# Patient Record
Sex: Female | Born: 1965 | Race: White | Hispanic: No | Smoking: Never smoker
Health system: Southern US, Community
[De-identification: ages and names within clinical notes are randomized; demographics above are authoritative.]

## PROBLEM LIST (undated history)

## (undated) DIAGNOSIS — J45909 Unspecified asthma, uncomplicated: Secondary | ICD-10-CM

## (undated) DIAGNOSIS — I10 Essential (primary) hypertension: Secondary | ICD-10-CM

## (undated) DIAGNOSIS — F329 Major depressive disorder, single episode, unspecified: Secondary | ICD-10-CM

## (undated) DIAGNOSIS — F32A Depression, unspecified: Secondary | ICD-10-CM

## (undated) DIAGNOSIS — D649 Anemia, unspecified: Secondary | ICD-10-CM

## (undated) DIAGNOSIS — F419 Anxiety disorder, unspecified: Secondary | ICD-10-CM

## (undated) DIAGNOSIS — E119 Type 2 diabetes mellitus without complications: Secondary | ICD-10-CM

## (undated) HISTORY — DX: Depression, unspecified: F32.A

## (undated) HISTORY — PX: CHOLECYSTECTOMY: SHX55

## (undated) HISTORY — DX: Anemia, unspecified: D64.9

## (undated) HISTORY — DX: Type 2 diabetes mellitus without complications: E11.9

## (undated) HISTORY — DX: Anxiety disorder, unspecified: F41.9

## (undated) HISTORY — DX: Major depressive disorder, single episode, unspecified: F32.9

## (undated) HISTORY — DX: Unspecified asthma, uncomplicated: J45.909

---

## 2014-09-25 ENCOUNTER — Ambulatory Visit (INDEPENDENT_AMBULATORY_CARE_PROVIDER_SITE_OTHER): Payer: BLUE CROSS/BLUE SHIELD | Admitting: Emergency Medicine

## 2014-09-25 VITALS — BP 120/78 | HR 82 | Temp 98.2°F | Resp 14 | Ht 65.5 in | Wt 271.6 lb

## 2014-09-25 DIAGNOSIS — H60333 Swimmer's ear, bilateral: Secondary | ICD-10-CM

## 2014-09-25 DIAGNOSIS — H66009 Acute suppurative otitis media without spontaneous rupture of ear drum, unspecified ear: Secondary | ICD-10-CM

## 2014-09-25 MED ORDER — NEOMYCIN-POLYMYXIN-HC 3.5-10000-1 OT SUSP: 3.0000 [drp] | Freq: Four times a day (QID) | OTIC | Status: DC

## 2014-09-25 MED ORDER — TRIAMCINOLONE ACETONIDE 55 MCG/ACT NA AERO: 2.0000 | INHALATION_SPRAY | Freq: Every day | NASAL | Status: DC

## 2014-09-25 MED ORDER — AMOXICILLIN-POT CLAVULANATE 875-125 MG PO TABS: 1.0000 | ORAL_TABLET | Freq: Two times a day (BID) | ORAL | Status: DC

## 2014-09-25 NOTE — Progress Notes (Signed)
Subjective:  Patient ID: Shelby Scott, female    DOB: 1965-06-23  Age: 49 y.o. MRN: [id number]  CC: Difficulty hearing; Itchy ear; Tinnitus; and Ear Pain   HPI Ethan Kasperski presents  gives a history of recurrent otitis media last treated by her ear and nose and throat doctor in [place]. She has noted decreased hearing over the last week associated with itching in her ears. She has tenderness in some pain. She said tinnitus been present for quite a while a fever chills nausea vomiting no cough or runny nose. No sore throat. She's had no improvement with over-the-counter medication.  History Rateel has a past medical history of Anemia; Anxiety; Asthma; Depression; and Diabetes mellitus without complication.   She has past surgical history that includes Cholecystectomy and Appendectomy.   Her  family history includes Diabetes in her brother, maternal grandmother, and paternal grandmother; Heart disease in her paternal grandfather; Hypertension in her father and paternal grandmother; Mental illness in her brother and maternal grandfather.  She   reports that she has never smoked. She does not have any smokeless tobacco history on file. Her alcohol and drug histories are not on file.  No outpatient prescriptions prior to visit.   No facility-administered medications prior to visit.    History   Social History  . Marital Status: Divorced    Spouse Name: N/A  . Number of Children: N/A  . Years of Education: N/A   Social History Main Topics  . Smoking status: Never Smoker   . Smokeless tobacco: Not on file  . Alcohol Use: Not on file  . Drug Use: Not on file  . Sexual Activity: Not on file   Other Topics Concern  . None   Social History Narrative  . None     Review of Systems  Constitutional: Negative for fever, chills and appetite change.  HENT: Positive for ear pain, hearing loss and tinnitus. Negative for congestion, postnasal drip, sinus pressure and sore throat.     Eyes: Negative for pain and redness.  Respiratory: Negative for cough, shortness of breath and wheezing.   Cardiovascular: Negative for leg swelling.  Gastrointestinal: Negative for nausea, vomiting, abdominal pain, diarrhea, constipation and blood in stool.  Endocrine: Negative for polyuria.  Genitourinary: Negative for dysuria, urgency, frequency and flank pain.  Musculoskeletal: Negative for gait problem.  Skin: Negative for rash.  Neurological: Negative for weakness and headaches.  Psychiatric/Behavioral: Negative for confusion and decreased concentration. The patient is not nervous/anxious.     Objective:  BP 120/78 mmHg  Pulse 82  Temp(Src) 98.2 F (36.8 C) (Oral)  Resp 14  Ht 5' 5.5" (1.664 m)  Wt 271 lb 9.6 oz (123.197 kg)  BMI 44.49 kg/m2  SpO2 98%  LMP 09/04/2014  Physical Exam  Constitutional: She is oriented to person, place, and time. She appears well-developed and well-nourished. No distress.  HENT:  Head: Normocephalic and atraumatic.  Right Ear: There is drainage. Tympanic membrane is not perforated and not erythematous. A middle ear effusion is present.  Left Ear: There is drainage. Tympanic membrane is not perforated and not erythematous. A middle ear effusion is present.  Nose: Nose normal.  Eyes: Conjunctivae and EOM are normal. Pupils are equal, round, and reactive to light. No scleral icterus.  Neck: Normal range of motion. Neck supple. No tracheal deviation present.  Cardiovascular: Normal rate, regular rhythm and normal heart sounds.   Pulmonary/Chest: Effort normal. No respiratory distress. She has no wheezes. She has no rales.  Abdominal: She exhibits no mass. There is no tenderness. There is no rebound and no guarding.  Musculoskeletal: She exhibits no edema.  Lymphadenopathy:    She has no cervical adenopathy.  Neurological: She is alert and oriented to person, place, and time. Coordination normal.  Skin: Skin is warm and dry. No rash noted.   Psychiatric: She has a normal mood and affect. Her behavior is normal.   She has bilateral serous otitis media as well as changes consistent with external otitis in both ears.   Assessment & Plan:   Xyla was seen today for difficulty hearing, itchy ear, tinnitus and ear pain.  Diagnoses and all orders for this visit:  Acute suppurative otitis media without spontaneous rupture of ear drum, recurrence not specified, unspecified laterality  Swimmer's ear, bilateral  Other orders -     triamcinolone (NASACORT AQ) 55 MCG/ACT AERO nasal inhaler; Place 2 sprays into the nose daily. -     amoxicillin-clavulanate (AUGMENTIN) 875-125 MG per tablet; Take 1 tablet by mouth 2 (two) times daily. -     neomycin-polymyxin-hydrocortisone (CORTISPORIN) 3.5-10000-1 otic suspension; Place 3 drops into both ears 4 (four) times daily.   I am having Ms. Evangelist start on triamcinolone, amoxicillin-clavulanate, and neomycin-polymyxin-hydrocortisone. I am also having her maintain her metFORMIN, venlafaxine XR, glimepiride, gabapentin, insulin detemir, and Liraglutide (VICTOZA [place]).  Meds ordered this encounter  Medications  . metFORMIN (GLUCOPHAGE) 1000 MG tablet    Sig: Take 1,000 mg by mouth 2 (two) times daily with a meal.  . venlafaxine XR (EFFEXOR XR) 150 MG 24 hr capsule    Sig: Take 150 mg by mouth daily with breakfast.  . glimepiride (AMARYL) 2 MG tablet    Sig: Take 2 mg by mouth daily with breakfast.  . gabapentin (NEURONTIN) 300 MG capsule    Sig: Take 300 mg by mouth 3 (three) times daily.  . insulin detemir (LEVEMIR) 100 UNIT/ML injection    Sig: Inject into the skin 2 (two) times daily.  . Liraglutide (VICTOZA [place])    Sig: Inject into the skin.  Marland Kitchen triamcinolone (NASACORT AQ) 55 MCG/ACT AERO nasal inhaler    Sig: Place 2 sprays into the nose daily.    Dispense:  1 Inhaler    Refill:  12  . amoxicillin-clavulanate (AUGMENTIN) 875-125 MG per tablet    Sig: Take 1 tablet by mouth 2 (two)  times daily.    Dispense:  20 tablet    Refill:  0  . neomycin-polymyxin-hydrocortisone (CORTISPORIN) 3.5-10000-1 otic suspension    Sig: Place 3 drops into both ears 4 (four) times daily.    Dispense:  10 mL    Refill:  0    Appropriate red flag conditions were discussed with the patient as well as actions that should be taken.  Patient expressed his understanding.  Follow-up: Return if symptoms worsen or fail to improve.  Carmelina Dane, MD

## 2014-09-25 NOTE — Patient Instructions (Signed)
Serous Otitis Media °Serous otitis media is fluid in the middle ear space. This space contains the bones for hearing and air. Air in the middle ear space helps to transmit sound.  °The air gets there through the eustachian tube. This tube goes from the back of the nose (nasopharynx) to the middle ear space. It keeps the pressure in the middle ear the same as the outside world. It also helps to drain fluid from the middle ear space. °CAUSES  °Serous otitis media occurs when the eustachian tube gets blocked. Blockage can come from: °· Ear infections. °· Colds and other upper respiratory infections. °· Allergies. °· Irritants such as cigarette smoke. °· Sudden changes in air pressure (such as descending in an airplane). °· Enlarged adenoids. °· A mass in the nasopharynx. °During colds and upper respiratory infections, the middle ear space can become temporarily filled with fluid. This can happen after an ear infection also. Once the infection clears, the fluid will generally drain out of the ear through the eustachian tube. If it does not, then serous otitis media occurs. °SIGNS AND SYMPTOMS  °· Hearing loss. °· A feeling of fullness in the ear, without pain. °· Young children may not show any symptoms but may show slight behavioral changes, such as agitation, ear pulling, or crying. °DIAGNOSIS  °Serous otitis media is diagnosed by an ear exam. Tests may be done to check on the movement of the eardrum. Hearing exams may also be done. °TREATMENT  °The fluid most often goes away without treatment. If allergy is the cause, allergy treatment may be helpful. Fluid that persists for several months may require minor surgery. A small tube is placed in the eardrum to: °· Drain the fluid. °· Restore the air in the middle ear space. °In certain situations, antibiotic medicines are used to avoid surgery. Surgery may be done to remove enlarged adenoids (if this is the cause). °HOME CARE INSTRUCTIONS  °· Keep children away from  tobacco smoke. °· Keep all follow-up visits as directed by your health care provider. °SEEK MEDICAL CARE IF:  °· Your hearing is not better in 3 months. °· Your hearing is worse. °· You have ear pain. °· You have drainage from the ear. °· You have dizziness. °· You have serous otitis media only in one ear or have any bleeding from your nose (epistaxis). °· You notice a lump on your neck. °MAKE SURE YOU: °· Understand these instructions.   °· Will watch your condition.   °· Will get help right away if you are not doing well or get worse.   °Document Released: 05/02/2003 Document Revised: 06/26/2013 Document Reviewed: 09/06/2012 °[employer]® Patient Information ©2015 [employer]. This information is not intended to replace advice given to you by your health care provider. Make sure you discuss any questions you have with your health care provider. ° °

## 2014-10-08 ENCOUNTER — Ambulatory Visit (INDEPENDENT_AMBULATORY_CARE_PROVIDER_SITE_OTHER): Payer: BLUE CROSS/BLUE SHIELD | Admitting: Physician Assistant

## 2014-10-08 VITALS — BP 156/82 | HR 82 | Temp 98.3°F | Resp 16 | Ht 65.5 in | Wt 270.0 lb

## 2014-10-08 DIAGNOSIS — S90829A Blister (nonthermal), unspecified foot, initial encounter: Secondary | ICD-10-CM

## 2014-10-08 DIAGNOSIS — S90821A Blister (nonthermal), right foot, initial encounter: Secondary | ICD-10-CM

## 2014-10-08 DIAGNOSIS — E1140 Type 2 diabetes mellitus with diabetic neuropathy, unspecified: Secondary | ICD-10-CM | POA: Diagnosis not present

## 2014-10-08 DIAGNOSIS — S90822A Blister (nonthermal), left foot, initial encounter: Secondary | ICD-10-CM

## 2014-10-08 NOTE — Progress Notes (Signed)
   Subjective:    Patient ID: Shelby Scott, female    DOB: [birth year], 49 y.o.   MRN: [id number]  Chief Complaint  Patient presents with  . Blisters    Onset 4 days/ diabetic   Medications, allergies, past medical history, surgical history, family history, social history and problem list reviewed and updated.  HPI  20 yof presents with blisters on feet.  New to area and is type 2 diabetic. Was helping a friend move while wearing poorly fitting sandals few days ago and developed several blisters bilateral feet. Has not been applying anything to area. Denies fevers, chills. She had uri sx 2 wks ago and just finished a 10 day course of augmentin yest.   Unsure of last a1c but thinks was in the 6 range. She is planning to establish care here.   Review of Systems See HPI.     Objective:   Physical Exam  Constitutional: She is oriented to person, place, and time. She appears well-developed and well-nourished.  Non-toxic appearance. She does not have a sickly appearance. She does not appear ill. No distress.  BP 156/82 mmHg  Pulse 82  Temp(Src) 98.3 F (36.8 C) (Oral)  Resp 16  Ht 5' 5.5" (1.664 m)  Wt 270 lb (122.471 kg)  BMI 44.23 kg/m2  SpO2 98%  LMP 09/04/2014   Cardiovascular:  Normal cap refill.   Neurological: She is alert and oriented to person, place, and time.  Skin:  1cm x 1cm blister plantar great toe left foot. Smaller blister just proximal to this. 5mm blister right plantar foot. No drainage. No surrounding erythema.       Assessment & Plan:   Friction blisters of the soles, unspecified laterality, initial encounter  Type 2 diabetes mellitus with diabetic neuropathy --wounds cleansed and dressed per procedure note --leave open while at home, cover when out --no signs of infection today and just completed augmentin course --strongly encouraged to establish with pcp here asap to establish dm care and possibly see podiatry if wounds don't heal well   Donnajean Lopes, PA-C Physician Assistant-Certified [facility]  10/08/2014 1:34 PM

## 2014-10-08 NOTE — Progress Notes (Signed)
Blisters on bilateral soles cleansed with warm soap and water. Dressed with non stick gauze, gauze, and coban. Pt instructed to leave wounds open while clean and at home.

## 2015-01-05 ENCOUNTER — Ambulatory Visit (INDEPENDENT_AMBULATORY_CARE_PROVIDER_SITE_OTHER): Payer: 59 | Admitting: Emergency Medicine

## 2015-01-05 VITALS — BP 118/82 | HR 92 | Temp 98.2°F | Resp 16 | Ht 66.25 in | Wt 269.0 lb

## 2015-01-05 DIAGNOSIS — E1140 Type 2 diabetes mellitus with diabetic neuropathy, unspecified: Secondary | ICD-10-CM

## 2015-01-05 DIAGNOSIS — Z23 Encounter for immunization: Secondary | ICD-10-CM

## 2015-01-05 LAB — GLUCOSE, POCT (MANUAL RESULT ENTRY): POC Glucose: 236 mg/dl — AB (ref 70–99)

## 2015-01-05 LAB — POCT URINALYSIS DIP (MANUAL ENTRY)
BILIRUBIN UA: NEGATIVE
Glucose, UA: 100 — AB
NITRITE UA: NEGATIVE
PH UA: 6
RBC UA: NEGATIVE
Spec Grav, UA: 1.025
UROBILINOGEN UA: 0.2

## 2015-01-05 LAB — LIPID PANEL
Cholesterol: 155 mg/dL (ref 125–200)
HDL: 42 mg/dL — AB (ref >=46)
LDL CALC: 72 mg/dL (ref <130)
Total CHOL/HDL Ratio: 3.7 Ratio (ref <=5.0)
Triglycerides: 206 mg/dL — ABNORMAL HIGH (ref <150)
VLDL: 41 mg/dL — ABNORMAL HIGH (ref <30)

## 2015-01-05 LAB — COMPREHENSIVE METABOLIC PANEL
ALK PHOS: 101 U/L (ref 33–130)
ALT: 23 U/L (ref 6–29)
AST: 21 U/L (ref 10–35)
Albumin: 4.2 g/dL (ref 3.6–5.1)
BILIRUBIN TOTAL: 0.3 mg/dL (ref 0.2–1.2)
BUN: 10 mg/dL (ref 7–25)
CALCIUM: 8.7 mg/dL (ref 8.6–10.4)
CO2: 24 mmol/L (ref 20–31)
CREATININE: 0.63 mg/dL (ref 0.50–1.05)
Chloride: 99 mmol/L (ref 98–110)
GLUCOSE: 243 mg/dL — AB (ref 65–99)
Potassium: 4.2 mmol/L (ref 3.5–5.3)
SODIUM: 134 mmol/L — AB (ref 135–146)
Total Protein: 7.4 g/dL (ref 6.1–8.1)

## 2015-01-05 LAB — POC MICROSCOPIC URINALYSIS (UMFC)

## 2015-01-05 LAB — POCT GLYCOSYLATED HEMOGLOBIN (HGB A1C): HEMOGLOBIN A1C: 8.5

## 2015-01-05 LAB — HEMOGLOBIN A1C: Hgb A1c MFr Bld: 8.5 % — AB (ref 4.0–6.0)

## 2015-01-05 MED ORDER — METFORMIN HCL 1000 MG PO TABS: 1000.0000 mg | ORAL_TABLET | Freq: Two times a day (BID) | ORAL | Status: DC

## 2015-01-05 MED ORDER — GLIMEPIRIDE 4 MG PO TABS: 4.0000 mg | ORAL_TABLET | Freq: Two times a day (BID) | ORAL | Status: DC

## 2015-01-05 MED ORDER — INSULIN DETEMIR 100 UNIT/ML ~~LOC~~ SOLN: SUBCUTANEOUS | Status: DC

## 2015-01-05 MED ORDER — VENLAFAXINE HCL ER 150 MG PO CP24: 150.0000 mg | ORAL_CAPSULE | Freq: Every day | ORAL | Status: DC

## 2015-01-05 MED ORDER — GABAPENTIN 300 MG PO CAPS: 300.0000 mg | ORAL_CAPSULE | Freq: Three times a day (TID) | ORAL | Status: DC

## 2015-01-05 MED ORDER — LIRAGLUTIDE 18 MG/3ML ~~LOC~~ SOPN: PEN_INJECTOR | SUBCUTANEOUS | Status: DC

## 2015-01-05 NOTE — Patient Instructions (Signed)

## 2015-01-05 NOTE — Progress Notes (Signed)
Subjective:  Patient ID: Shelby Scott, female    DOB: Dec 20, 1965  Age: 49 y.o. MRN: [id number]  CC: Medication Refill and Flu Vaccine   HPI Shelby Scott presents  for refill on her medication. She's a type II diabetic takes insulin and has neuropathy complicating her diabetes. She's recently moved here from [place] and has no doctor the area. She's not been seen by ophthalmologist nor and endocrinologist. Her sugars are running high. She has no other complaints.  History Echo has a past medical history of Anemia; Anxiety; Asthma; Depression; and Diabetes mellitus without complication (HCC).   She has past surgical history that includes Cholecystectomy and Appendectomy.   Her  family history includes Diabetes in her brother, maternal grandmother, and paternal grandmother; Heart disease in her paternal grandfather; Hypertension in her father and paternal grandmother; Mental illness in her brother and maternal grandfather.  She   reports that she has never smoked. She does not have any smokeless tobacco history on file. Her alcohol and drug histories are not on file.  Outpatient Prescriptions Prior to Visit  Medication Sig Dispense Refill  . triamcinolone (NASACORT AQ) 55 MCG/ACT AERO nasal inhaler Place 2 sprays into the nose daily. 1 Inhaler 12  . gabapentin (NEURONTIN) 300 MG capsule Take 300 mg by mouth 3 (three) times daily.    Marland Kitchen glimepiride (AMARYL) 2 MG tablet Take 2 mg by mouth daily with breakfast.    . insulin detemir (LEVEMIR) 100 UNIT/ML injection Inject into the skin 2 (two) times daily.    . Liraglutide (VICTOZA ) Inject into the skin.    . metFORMIN (GLUCOPHAGE) 1000 MG tablet Take 1,000 mg by mouth 2 (two) times daily with a meal.    . venlafaxine XR (EFFEXOR XR) 150 MG 24 hr capsule Take 150 mg by mouth daily with breakfast.    . neomycin-polymyxin-hydrocortisone (CORTISPORIN) 3.5-10000-1 otic suspension Place 3 drops into both ears 4 (four) times daily. (Patient not  taking: Reported on 01/05/2015) 10 mL 0   No facility-administered medications prior to visit.    Social History   Social History  . Marital Status: Divorced    Spouse Name: N/A  . Number of Children: N/A  . Years of Education: N/A   Social History Main Topics  . Smoking status: Never Smoker   . Smokeless tobacco: None  . Alcohol Use: None  . Drug Use: None  . Sexual Activity: Not Asked   Other Topics Concern  . None   Social History Narrative     Review of Systems  Constitutional: Negative for fever, chills and appetite change.  HENT: Negative for congestion, ear pain, postnasal drip, sinus pressure and sore throat.   Eyes: Negative for pain and redness.  Respiratory: Negative for cough, shortness of breath and wheezing.   Cardiovascular: Negative for leg swelling.  Gastrointestinal: Negative for nausea, vomiting, abdominal pain, diarrhea, constipation and blood in stool.  Endocrine: Negative for polyuria.  Genitourinary: Negative for dysuria, urgency, frequency and flank pain.  Musculoskeletal: Negative for gait problem.  Skin: Negative for rash.  Neurological: Positive for numbness. Negative for weakness and headaches.  Psychiatric/Behavioral: Negative for confusion and decreased concentration. The patient is not nervous/anxious.     Objective:  BP 118/82 mmHg  Pulse 92  Temp(Src) 98.2 F (36.8 C) (Oral)  Resp 16  Ht 5' 6.25" (1.683 m)  Wt 269 lb (122.018 kg)  BMI 43.08 kg/m2  SpO2 98%  LMP 12/25/2014  Physical Exam  Constitutional: She is oriented  to person, place, and time. She appears well-nourished. No distress.  Morbid obesity  HENT:  Head: Normocephalic and atraumatic.  Right Ear: External ear normal.  Left Ear: External ear normal.  Nose: Nose normal.  Eyes: Conjunctivae and EOM are normal. Pupils are equal, round, and reactive to light. No scleral icterus.  Neck: Normal range of motion. Neck supple. No tracheal deviation present.    Cardiovascular: Normal rate, regular rhythm and normal heart sounds.   Pulmonary/Chest: Effort normal. No respiratory distress. She has no wheezes. She has no rales.  Abdominal: She exhibits no mass. There is no tenderness. There is no rebound and no guarding.  Musculoskeletal: She exhibits no edema.  Lymphadenopathy:    She has no cervical adenopathy.  Neurological: She is alert and oriented to person, place, and time. Coordination normal.  Skin: Skin is warm and dry. No rash noted.  Psychiatric: She has a normal mood and affect. Her behavior is normal.      Assessment & Plan:   Shelby Scott was seen today for medication refill and flu vaccine.  Diagnoses and all orders for this visit:  Type 2 diabetes mellitus with diabetic neuropathy, unspecified long term insulin use status (HCC) -     POCT glucose (manual entry) -     POCT glycosylated hemoglobin (Hb A1C) -     Comprehensive metabolic panel -     Lipid panel -     VITAMIN D 25 Hydroxy (Vit-D Deficiency, Fractures) -     POCT urinalysis dipstick -     POCT Microscopic Urinalysis ([facility]) -     Ambulatory referral to Endocrinology -     Ambulatory referral to Ophthalmology  Flu vaccine need -     Flu Vaccine QUAD 36+ mos IM  Morbid obesity due to excess calories (HCC) -     Ambulatory referral to General Surgery  Other orders -     venlafaxine XR (EFFEXOR XR) 150 MG 24 hr capsule; Take 1 capsule (150 mg total) by mouth daily with breakfast. -     metFORMIN (GLUCOPHAGE) 1000 MG tablet; Take 1 tablet (1,000 mg total) by mouth 2 (two) times daily with a meal. -     Liraglutide (VICTOZA) 18 MG/3ML SOPN; As discussed -     insulin detemir (LEVEMIR) 100 UNIT/ML injection; As directed -     glimepiride (AMARYL) 4 MG tablet; Take 1 tablet (4 mg total) by mouth 2 (two) times daily. -     gabapentin (NEURONTIN) 300 MG capsule; Take 1 capsule (300 mg total) by mouth 3 (three) times daily.   I have changed Shelby Scott's Liraglutide  (VICTOZA [place]) to Liraglutide (VICTOZA) 18 MG/3ML SOPN. I have also changed her venlafaxine XR, metFORMIN, insulin detemir, glimepiride, and gabapentin. I am also having her maintain her triamcinolone and neomycin-polymyxin-hydrocortisone.  Meds ordered this encounter  Medications  . venlafaxine XR (EFFEXOR XR) 150 MG 24 hr capsule    Sig: Take 1 capsule (150 mg total) by mouth daily with breakfast.    Dispense:  90 capsule    Refill:  3  . metFORMIN (GLUCOPHAGE) 1000 MG tablet    Sig: Take 1 tablet (1,000 mg total) by mouth 2 (two) times daily with a meal.    Dispense:  120 tablet    Refill:  2  . Liraglutide (VICTOZA) 18 MG/3ML SOPN    Sig: As discussed    Dispense:  6 mL    Refill:  2  . insulin detemir (LEVEMIR) 100  UNIT/ML injection    Sig: As directed    Dispense:  10 mL    Refill:  2  . glimepiride (AMARYL) 4 MG tablet    Sig: Take 1 tablet (4 mg total) by mouth 2 (two) times daily.    Dispense:  60 tablet    Refill:  2  . gabapentin (NEURONTIN) 300 MG capsule    Sig: Take 1 capsule (300 mg total) by mouth 3 (three) times daily.    Dispense:  90 capsule    Refill:  2    Appropriate red flag conditions were discussed with the patient as well as actions that should be taken.  Patient expressed his understanding.  Follow-up: Return in about 3 months (around 04/07/2015).  Carmelina Dane, MD   Results for orders placed or performed in visit on 01/05/15  POCT glucose (manual entry)  Result Value Ref Range   POC Glucose 236 (A) 70 - 99 mg/dl  POCT glycosylated hemoglobin (Hb A1C)  Result Value Ref Range   Hemoglobin A1C 8.5   POCT urinalysis dipstick  Result Value Ref Range   Color, UA yellow yellow   Clarity, UA clear clear   Glucose, UA =100 (A) negative   Bilirubin, UA negative negative   Ketones, POC UA trace (5) (A) negative   Spec Grav, UA 1.025    Blood, UA negative negative   pH, UA 6.0    Protein Ur, POC =30 (A) negative   Urobilinogen, UA 0.2     Nitrite, UA Negative Negative   Leukocytes, UA Trace (A) Negative  POCT Microscopic Urinalysis ([facility])  Result Value Ref Range   WBC,UR,HPF,POC Few (A) None WBC/hpf   RBC,UR,HPF,POC None None RBC/hpf   Bacteria Moderate (A) None, Too numerous to count   Mucus Present (A) Absent   Epithelial Cells, UR Per Microscopy Moderate (A) None, Too numerous to count cells/hpf

## 2015-01-07 LAB — VITAMIN D 25 HYDROXY (VIT D DEFICIENCY, FRACTURES): VIT D 25 HYDROXY: 33 ng/mL (ref 30–100)

## 2015-01-08 ENCOUNTER — Telehealth: Payer: Self-pay

## 2015-01-08 NOTE — Telephone Encounter (Signed)
Pt calling about labs. Please review. Thanks  

## 2015-01-21 ENCOUNTER — Encounter: Payer: Self-pay | Admitting: Family Medicine

## 2015-02-02 ENCOUNTER — Other Ambulatory Visit: Payer: Self-pay

## 2015-02-02 ENCOUNTER — Ambulatory Visit (INDEPENDENT_AMBULATORY_CARE_PROVIDER_SITE_OTHER): Payer: 59 | Admitting: Family Medicine

## 2015-02-02 VITALS — BP 128/78 | HR 97 | Temp 98.1°F | Resp 18 | Ht 66.25 in | Wt 265.4 lb

## 2015-02-02 DIAGNOSIS — E1140 Type 2 diabetes mellitus with diabetic neuropathy, unspecified: Secondary | ICD-10-CM

## 2015-02-02 DIAGNOSIS — IMO0002 Reserved for concepts with insufficient information to code with codable children: Secondary | ICD-10-CM

## 2015-02-02 DIAGNOSIS — R3989 Other symptoms and signs involving the genitourinary system: Secondary | ICD-10-CM | POA: Diagnosis not present

## 2015-02-02 DIAGNOSIS — E1165 Type 2 diabetes mellitus with hyperglycemia: Secondary | ICD-10-CM | POA: Diagnosis not present

## 2015-02-02 LAB — POCT URINALYSIS DIP (MANUAL ENTRY)
Bilirubin, UA: NEGATIVE
Nitrite, UA: NEGATIVE
PROTEIN UA: NEGATIVE
RBC UA: NEGATIVE
SPEC GRAV UA: 1.025
Urobilinogen, UA: 0.2
pH, UA: 5.5

## 2015-02-02 LAB — POC MICROSCOPIC URINALYSIS (UMFC): Mucus: ABSENT

## 2015-02-02 MED ORDER — INSULIN DETEMIR 100 UNIT/ML ~~LOC~~ SOLN: SUBCUTANEOUS | Status: DC

## 2015-02-02 MED ORDER — CIPROFLOXACIN HCL 250 MG PO TABS: 250.0000 mg | ORAL_TABLET | Freq: Two times a day (BID) | ORAL | Status: DC

## 2015-02-02 NOTE — Telephone Encounter (Signed)
Pt saw dr Milus Glazierlauenstein earlier today and called in levemir but it was the vial not the pen patient needs to the pen  [phone] Best number

## 2015-02-02 NOTE — Patient Instructions (Signed)
Status: Finalresult Visible to patient:  Not Released Nextappt: None Dx:  Type 2 diabetes mellitus with diabeti...          4wk ago    Hemoglobin A1C 8.5   Resulting Agency [id number]      Specimen Collected: 01/05/15 8:53 AM Last Resulted: 01/05/15 8:53 AM                      Other Results from 01/05/2015         POCT glucose (manual entry)  Status: Finalresult Visible to patient:  Not Released Nextappt: None Dx:  Type 2 diabetes mellitus with diabeti...             Ref Range 4wk ago    POC Glucose 70 - 99 mg/dl 409236 (A)   Resulting Agency [facility]       Specimen Collected: 01/05/15 8:53 AM Last Resulted: 01/05/15 8:53 AM

## 2015-02-02 NOTE — Progress Notes (Signed)
Patient ID: Shelby Scott, female   DOB: 1965/04/29, 50 y.o.   MRN: [id number]   This chart was scribed for Elvina Sidle, MD by [facility], medical scribe at [facility].The patient was seen in exam room 12 and the patient's care was started at 10:31 AM.  Patient ID: Shelby Scott MRN: [id number], DOB: 16-Jan-1966, 49 y.o. Date of Encounter: 02/02/2015  Primary Physician: No primary care provider on file.  Chief Complaint:  Chief Complaint  Patient presents with  . Medication Refill    needs a new RX for Levemir.   Marland Kitchen Urinary Symptoms    x2 weeks, has noticed a color change in urine. No back or abdomen pain.    HPI:  Shelby Scott is a 49 y.o. female who presents to [facility] with concerns of urinary symptoms for the past two weeks. No dysuria, only noticed a discoloration (bright pink). Some urinary incontinence. Desk work  Medication Refill: Also needs a Levemir refill. She has been without her medication for 1 week.   Past Medical History  Diagnosis Date  . Anemia   . Anxiety   . Asthma   . Depression   . Diabetes mellitus without complication (HCC)     Home Meds: Prior to Admission medications   Medication Sig Start Date End Date Taking? Authorizing Provider  gabapentin (NEURONTIN) 300 MG capsule Take 1 capsule (300 mg total) by mouth 3 (three) times daily. 01/05/15  Yes Carmelina Dane, MD  glimepiride (AMARYL) 4 MG tablet Take 1 tablet (4 mg total) by mouth 2 (two) times daily. 01/05/15  Yes Carmelina Dane, MD  insulin detemir (LEVEMIR) 100 UNIT/ML injection As directed 01/05/15  Yes Carmelina Dane, MD  Liraglutide (VICTOZA) 18 MG/3ML SOPN As discussed 01/05/15  Yes Carmelina Dane, MD  metFORMIN (GLUCOPHAGE) 1000 MG tablet Take 1 tablet (1,000 mg total) by mouth 2 (two) times daily with a meal. 01/05/15  Yes Carmelina Dane, MD  triamcinolone (NASACORT AQ) 55 MCG/ACT AERO nasal inhaler Place 2 sprays into the nose  daily. 09/25/14  Yes Carmelina Dane, MD  venlafaxine XR (EFFEXOR XR) 150 MG 24 hr capsule Take 1 capsule (150 mg total) by mouth daily with breakfast. 01/05/15  Yes Carmelina Dane, MD  neomycin-polymyxin-hydrocortisone (CORTISPORIN) 3.5-10000-1 otic suspension Place 3 drops into both ears 4 (four) times daily. Patient not taking: Reported on 01/05/2015 09/25/14   Carmelina Dane, MD   Allergies: No Known Allergies  Social History   Social History  . Marital Status: Divorced    Spouse Name: N/A  . Number of Children: N/A  . Years of Education: N/A   Occupational History  . Not on file.   Social History Main Topics  . Smoking status: Never Smoker   . Smokeless tobacco: Not on file  . Alcohol Use: Not on file  . Drug Use: Not on file  . Sexual Activity: Not on file   Other Topics Concern  . Not on file   Social History Narrative    Review of Systems: Constitutional: negative for chills, fever, night sweats, weight changes, or fatigue  HEENT: negative for vision changes, hearing loss, congestion, rhinorrhea, ST, epistaxis, or sinus pressure Cardiovascular: negative for chest pain or palpitations Respiratory: negative for hemoptysis, wheezing, shortness of breath, or cough Abdominal: negative for abdominal pain, nausea, vomiting, diarrhea, or constipation Dermatological: negative for rash Neurologic: negative for headache, dizziness, or syncope All other systems reviewed and  are otherwise negative with the exception to those above and in the HPI.  Physical Exam: Blood pressure 128/78, pulse 97, temperature 98.1 F (36.7 C), temperature source Oral, resp. rate 18, height 5' 6.25" (1.683 m), weight 265 lb 6.4 oz (120.385 kg), last menstrual period 12/25/2014, SpO2 98 %., Body mass index is 42.5 kg/(m^2). General: Well developed, well nourished, in no acute distress. Head: Normocephalic, atraumatic, eyes without discharge, sclera non-icteric, nares are without discharge.  Bilateral auditory canals clear, TM's are without perforation, pearly grey and translucent with reflective cone of light bilaterally. Oral cavity moist, posterior pharynx without exudate, erythema, peritonsillar abscess, or post nasal drip.  Neck: Supple. No thyromegaly. Full ROM. No lymphadenopathy. Lungs: Clear bilaterally to auscultation without wheezes, rales, or rhonchi. Breathing is unlabored. Heart: RRR with S1 S2. No murmurs, rubs, or gallops appreciated. Abdomen: Soft, non-tender, non-distended with normoactive bowel sounds. No hepatomegaly. No rebound/guarding. No obvious abdominal masses. Msk:  Strength and tone normal for age. Extremities/Skin: Warm and dry. No clubbing or cyanosis. No edema. No rashes or suspicious lesions. Neuro: Alert and oriented X 3. Moves all extremities spontaneously. Gait is normal. CNII-XII grossly in tact. Psych:  Responds to questions appropriately with a normal affect.   Labs: Results for orders placed or performed in visit on 02/02/15  POCT urinalysis dipstick  Result Value Ref Range   Color, UA yellow yellow   Clarity, UA clear clear   Glucose, UA =100 (A) negative   Bilirubin, UA negative negative   Ketones, POC UA trace (5) (A) negative   Spec Grav, UA 1.025    Blood, UA negative negative   pH, UA 5.5    Protein Ur, POC negative negative   Urobilinogen, UA 0.2    Nitrite, UA Negative Negative   Leukocytes, UA Trace (A) Negative  POCT Microscopic Urinalysis ([facility])  Result Value Ref Range   WBC,UR,HPF,POC Few (A) None WBC/hpf   RBC,UR,HPF,POC None None RBC/hpf   Bacteria Few (A) None, Too numerous to count   Mucus Absent Absent   Epithelial Cells, UR Per Microscopy Few (A) None, Too numerous to count cells/hpf   ASSESSMENT AND PLAN:  49 y.o. year old female with  This chart was scribed in my presence and reviewed by me personally.    ICD-9-CM ICD-10-CM   1. Urine discoloration 791.9 R39.89 POCT urinalysis dipstick     POCT Microscopic  Urinalysis ([facility])     Urine culture     ciprofloxacin (CIPRO) 250 MG tablet  2. Type 2 diabetes, uncontrolled, with neuropathy (HCC) 250.62 E11.40 insulin detemir (LEVEMIR) 100 UNIT/ML injection   357.2 E11.65      Signed, Elvina SidleKurt Colbey Wirtanen, MD   By signing my name below, I, Nadim Abuhashem, attest that this documentation has been prepared under the direction and in the presence of Elvina SidleKurt Philomina Leon, MD.  Electronically Signed: Conchita ParisNadim Abuhashem, medical scribe. 02/02/2015 10:38 AM.

## 2015-02-03 LAB — URINE CULTURE: Colony Count: >=100000

## 2015-02-03 MED ORDER — INSULIN DETEMIR 100 UNIT/ML FLEXPEN: 30.0000 [IU] | PEN_INJECTOR | Freq: Every day | SUBCUTANEOUS | Status: DC

## 2015-02-03 NOTE — Telephone Encounter (Signed)
I have pended the Levemir pen, which is what she uses, but they will not accept take as directed, how many units do you have her using ? I can help if needed.

## 2015-02-11 ENCOUNTER — Other Ambulatory Visit: Payer: Self-pay

## 2015-02-11 MED ORDER — GLIMEPIRIDE 4 MG PO TABS: 4.0000 mg | ORAL_TABLET | Freq: Two times a day (BID) | ORAL | Status: DC

## 2015-02-26 ENCOUNTER — Ambulatory Visit (INDEPENDENT_AMBULATORY_CARE_PROVIDER_SITE_OTHER): Payer: BLUE CROSS/BLUE SHIELD

## 2015-02-26 ENCOUNTER — Other Ambulatory Visit: Payer: Self-pay

## 2015-02-26 ENCOUNTER — Ambulatory Visit (HOSPITAL_COMMUNITY)
Admission: RE | Admit: 2015-02-26 | Discharge: 2015-02-26 | Disposition: A | Discharge status: Home / Self Care | Payer: BLUE CROSS/BLUE SHIELD | Source: Ambulatory Visit | Attending: Physician Assistant | Admitting: Physician Assistant

## 2015-02-26 ENCOUNTER — Ambulatory Visit (HOSPITAL_BASED_OUTPATIENT_CLINIC_OR_DEPARTMENT_OTHER): Payer: Self-pay

## 2015-02-26 ENCOUNTER — Other Ambulatory Visit: Payer: Self-pay | Admitting: Physician Assistant

## 2015-02-26 ENCOUNTER — Telehealth: Payer: Self-pay | Admitting: Family Medicine

## 2015-02-26 ENCOUNTER — Encounter (HOSPITAL_COMMUNITY): Payer: Self-pay

## 2015-02-26 ENCOUNTER — Ambulatory Visit (INDEPENDENT_AMBULATORY_CARE_PROVIDER_SITE_OTHER): Payer: BLUE CROSS/BLUE SHIELD | Admitting: Physician Assistant

## 2015-02-26 ENCOUNTER — Telehealth: Payer: Self-pay

## 2015-02-26 VITALS — BP 139/82 | HR 80 | Temp 98.3°F | Resp 16 | Ht 66.0 in | Wt 271.0 lb

## 2015-02-26 DIAGNOSIS — R109 Unspecified abdominal pain: Secondary | ICD-10-CM | POA: Diagnosis present

## 2015-02-26 DIAGNOSIS — R1031 Right lower quadrant pain: Secondary | ICD-10-CM | POA: Diagnosis not present

## 2015-02-26 DIAGNOSIS — M545 Low back pain, unspecified: Secondary | ICD-10-CM

## 2015-02-26 DIAGNOSIS — R52 Pain, unspecified: Secondary | ICD-10-CM

## 2015-02-26 DIAGNOSIS — K5900 Constipation, unspecified: Secondary | ICD-10-CM | POA: Diagnosis not present

## 2015-02-26 DIAGNOSIS — R160 Hepatomegaly, not elsewhere classified: Secondary | ICD-10-CM | POA: Insufficient documentation

## 2015-02-26 DIAGNOSIS — M47814 Spondylosis without myelopathy or radiculopathy, thoracic region: Secondary | ICD-10-CM | POA: Insufficient documentation

## 2015-02-26 DIAGNOSIS — K760 Fatty (change of) liver, not elsewhere classified: Secondary | ICD-10-CM | POA: Diagnosis not present

## 2015-02-26 DIAGNOSIS — N83201 Unspecified ovarian cyst, right side: Secondary | ICD-10-CM | POA: Diagnosis not present

## 2015-02-26 DIAGNOSIS — R3129 Other microscopic hematuria: Secondary | ICD-10-CM

## 2015-02-26 LAB — POC MICROSCOPIC URINALYSIS (UMFC)

## 2015-02-26 LAB — POCT URINALYSIS DIP (MANUAL ENTRY)
BILIRUBIN UA: NEGATIVE
Glucose, UA: NEGATIVE
Nitrite, UA: NEGATIVE
PH UA: 5.5
RBC UA: NEGATIVE
Urobilinogen, UA: 0.2

## 2015-02-26 LAB — POCT I-STAT CREATININE: Creatinine, Ser: 0.6 mg/dL (ref 0.44–1.00)

## 2015-02-26 LAB — POCT CBC
GRANULOCYTE PERCENT: 73.9 % (ref 37–80)
HEMATOCRIT: 34.7 % — AB (ref 37.7–47.9)
HEMOGLOBIN: 11.9 g/dL — AB (ref 12.2–16.2)
Lymph, poc: 2.6 (ref 0.6–3.4)
MCH, POC: 28.6 pg (ref 27–31.2)
MCHC: 34.5 g/dL (ref 31.8–35.4)
MCV: 82.9 fL (ref 80–97)
MID (cbc): 0.4 (ref 0–0.9)
MPV: 7.1 fL (ref 0–99.8)
POC GRANULOCYTE: 8.5 — AB (ref 2–6.9)
POC LYMPH PERCENT: 22.4 %L (ref 10–50)
POC MID %: 3.7 % (ref 0–12)
Platelet Count, POC: 321 10*3/uL (ref 142–424)
RBC: 4.18 M/uL (ref 4.04–5.48)
RDW, POC: 15.9 %
WBC: 11.5 10*3/uL — AB (ref 4.6–10.2)

## 2015-02-26 LAB — GLUCOSE, POCT (MANUAL RESULT ENTRY): POC GLUCOSE: 154 mg/dL — AB (ref 70–99)

## 2015-02-26 LAB — POCT URINE PREGNANCY: Preg Test, Ur: NEGATIVE

## 2015-02-26 MED ORDER — IOHEXOL 300 MG/ML  SOLN
50.0000 mL | Freq: Once | INTRAMUSCULAR | Status: AC | PRN
  Administered 2015-02-26: 50 mL via ORAL

## 2015-02-26 MED ORDER — IOHEXOL 300 MG/ML  SOLN
100.0000 mL | Freq: Once | INTRAMUSCULAR | Status: AC | PRN
  Administered 2015-02-26: 100 mL via INTRAVENOUS

## 2015-02-26 MED ORDER — POLYETHYLENE GLYCOL 3350 17 GM/SCOOP PO POWD: ORAL | Status: DC

## 2015-02-26 NOTE — Telephone Encounter (Signed)
Per Judeth CornfieldStephanie call patient and let her know CT results (see report). Going to send in rx for Miralax to use 17 gms in 8oz water twice a day for 5 days to 1 week. She also needs to drink lots of water and hydrate. Called patient, no answer, left message to return call. RX sent [facility] [address]

## 2015-02-26 NOTE — Telephone Encounter (Signed)
Report indicated fatty liver, constipation, simple right ovarian cyst, see report

## 2015-02-26 NOTE — Patient Instructions (Addendum)
YOU ARE TO GO OVER TO [facility] MAIN ENTRANCE TO ADMITTING FOR YOUR CT SCAN AT 2PM After the imaging, we will go from there with a plan.

## 2015-02-26 NOTE — Progress Notes (Addendum)
[facility] [address] [place] [address] [phone]  Date:  02/26/2015   Name:  Shelby Scott   DOB:  28-Oct-1965   MRN:  [id number]  PCP:  No primary care provider on file.   Chief Complaint  Patient presents with  . Back Pain    right side   . Abdominal Pain    right side     History of Present Illness:  Shelby Scott is a 50 y.o. female patient with PMH of uncontrolled diabetes who presents to [facility] for right lower back pain. 2am woke up to a sharp pain in her lower right back, that then radiated to the front.  She had some nausea, but no emesis.  Sharp pain: at h.  Tried to move, but could not work.  Took 2 tylenols which seemed to not help much.  Ibuprofen helped.  No fever that she could tell, but mildly clammy.  No pain with urination, hematuria, no frequency.  No abnormal vaginal discharge.  No constipation, but thin bowels.  No hx of kidney stone.   Patient states that she is unsure if her appendix was removed.  She states that she did not think she ever had an appendectomy.  She does however remember cholecystectomy.  No hx of hysterectomy.   October last period    Patient Active Problem List   Diagnosis Date Noted  . Type 2 diabetes mellitus with diabetic neuropathy (HCC) 10/08/2014    Past Medical History  Diagnosis Date  . Anemia   . Anxiety   . Asthma   . Depression   . Diabetes mellitus without complication [facility])     Past Surgical History  Procedure Laterality Date  . Cholecystectomy    . Appendectomy      Social History  Substance Use Topics  . Smoking status: Never Smoker   . Smokeless tobacco: None  . Alcohol Use: None    Family History  Problem Relation Age of Onset  . Hypertension Father   . Diabetes Brother   . Mental illness Brother   . Diabetes Maternal Grandmother   . Mental illness Maternal Grandfather   . Diabetes Paternal Grandmother   . Hypertension Paternal Grandmother   . Heart disease Paternal Grandfather      No Known Allergies  Medication list has been reviewed and updated.  Current Outpatient Prescriptions on File Prior to Visit  Medication Sig Dispense Refill  . gabapentin (NEURONTIN) 300 MG capsule Take 1 capsule (300 mg total) by mouth 3 (three) times daily. 90 capsule 2  . glimepiride (AMARYL) 4 MG tablet Take 1 tablet (4 mg total) by mouth 2 (two) times daily. 180 tablet 0  . Insulin Detemir (LEVEMIR) 100 UNIT/ML Pen Inject 30 Units into the skin daily at 10 pm. 15 mL 11  . Liraglutide (VICTOZA) 18 MG/3ML SOPN As discussed 6 mL 2  . metFORMIN (GLUCOPHAGE) 1000 MG tablet Take 1 tablet (1,000 mg total) by mouth 2 (two) times daily with a meal. 120 tablet 2  . triamcinolone (NASACORT AQ) 55 MCG/ACT AERO nasal inhaler Place 2 sprays into the nose daily. 1 Inhaler 12  . venlafaxine XR (EFFEXOR XR) 150 MG 24 hr capsule Take 1 capsule (150 mg total) by mouth daily with breakfast. 90 capsule 3  . ciprofloxacin (CIPRO) 250 MG tablet Take 1 tablet (250 mg total) by mouth 2 (two) times daily. (Patient not taking: Reported on 02/26/2015) 6 tablet 0  . neomycin-polymyxin-hydrocortisone (CORTISPORIN) 3.5-10000-1  otic suspension Place 3 drops into both ears 4 (four) times daily. (Patient not taking: Reported on 02/26/2015) 10 mL 0   No current facility-administered medications on file prior to visit.    ROS ROS otherwise unremarkable unless listed above.   Physical Examination: BP 139/82 mmHg  Pulse 80  Temp(Src) 98.3 F (36.8 C) (Oral)  Resp 16  Ht 5\' 6"  (1.676 m)  Wt 271 lb (122.925 kg)  BMI 43.76 kg/m2  SpO2 98% Ideal Body Weight: Weight in (lb) to have BMI = 25: 154.6  Physical Exam  Constitutional: She is oriented to person, place, and time. She appears well-developed and well-nourished. No distress.  HENT:  Head: Normocephalic and atraumatic.  Right Ear: External ear normal.  Left Ear: External ear normal.  Eyes: Conjunctivae and EOM are normal. Pupils are equal, round, and  reactive to light.  Cardiovascular: Normal rate.  Exam reveals no gallop and no friction rub.   No murmur heard. Pulses:      Radial pulses are 2+ on the right side, and 2+ on the left side.       Dorsalis pedis pulses are 2+ on the right side, and 2+ on the left side.  Pulmonary/Chest: Effort normal. No apnea. No respiratory distress. She has no decreased breath sounds. She has no wheezes. She has no rhonchi.  Abdominal: Soft. Normal appearance and bowel sounds are normal. There is tenderness in the right lower quadrant. There is no rigidity, no guarding and no CVA tenderness.  Minimal faded yellow bruises consistent with injections.  Tenderness at the rlq.   No hernia suspected upon palpation.    Musculoskeletal:  Tender along the SI joint with radiating pain to front  Neurological: She is alert and oriented to person, place, and time.  Skin: Skin is warm and dry. She is not diaphoretic. No erythema. No pallor.  Psychiatric: She has a normal mood and affect. Her behavior is normal.    Results for orders placed or performed in visit on 02/26/15  POCT urinalysis dipstick  Result Value Ref Range   Color, UA yellow yellow   Clarity, UA hazy (A) clear   Glucose, UA negative negative   Bilirubin, UA negative negative   Ketones, POC UA trace (5) (A) negative   Spec Grav, UA >=1.030    Blood, UA negative negative   pH, UA 5.5    Protein Ur, POC =30 (A) negative   Urobilinogen, UA 0.2    Nitrite, UA Negative Negative   Leukocytes, UA Trace (A) Negative  POCT Microscopic Urinalysis ([facility])  Result Value Ref Range   WBC,UR,HPF,POC Few (A) None WBC/hpf   RBC,UR,HPF,POC Few (A) None RBC/hpf   Bacteria Few (A) None, Too numerous to count   Mucus Present (A) Absent   Epithelial Cells, UR Per Microscopy Few (A) None, Too numerous to count cells/hpf  POCT CBC  Result Value Ref Range   WBC 11.5 (A) 4.6 - 10.2 K/uL   Lymph, poc 2.6 0.6 - 3.4   POC LYMPH PERCENT 22.4 10 - 50 %L   MID (cbc)  0.4 0 - 0.9   POC MID % 3.7 0 - 12 %M   POC Granulocyte 8.5 (A) 2 - 6.9   Granulocyte percent 73.9 37 - 80 %G   RBC 4.18 4.04 - 5.48 M/uL   Hemoglobin 11.9 (A) 12.2 - 16.2 g/dL   HCT, POC 16.1 (A) 09.6 - 47.9 %   MCV 82.9 80 - 97 fL   MCH,  POC 28.6 27 - 31.2 pg   MCHC 34.5 31.8 - 35.4 g/dL   RDW, POC 16.1 %   Platelet Count, POC 321 142 - 424 K/uL   MPV 7.1 0 - 99.8 fL  POCT glucose (manual entry)  Result Value Ref Range   POC Glucose 154 (A) 70 - 99 mg/dl    [facility] reading (PRIMARY) by  Dr. Patsy Lager: Moderate stool burden  Assessment and Plan: Shelby Scott is a 50 y.o. female who is here today for cc of back pain that has radiated to her rlq Diff dx: appendicitis, kidney stone, uti, pyelo, si joint pain, hernia, constipation. Due to shift, and patient can not readily state whether she has had her appendix removed (despite surgical hx)--we will proceed to CT abdomen pelvis to rule out appendicitis.   --Treatment to follow pending lab work  Trena Platt, PA-C [facility] 02/26/2015 8:33 AM  J Copland 1/4:  Received her CT results as below last night.  Not emergent- called her today to check on her.  She is feeling a bit better, has not yet started the miralax. Discussed CT findings- we will plan to treat this like constipation for now and I will refer to GI due to insippated stool in her ileum.  If her pain returns or any other change in her sx she will let us know.    Did referral to GI for her Advised that she does indeed have her appendix- corrected her chart to reflect this.    CT ABDOMEN AND PELVIS WITH CONTRAST  TECHNIQUE: Multidetector CT imaging of the abdomen and pelvis was performed using the standard protocol following bolus administration of intravenous contrast.  CONTRAST: 100 ml Omnipaque 300 IV. Oral contrast was also administered.  COMPARISON: None.  FINDINGS: Lower chest: Heart size upper normal.  Visualized lung bases clear.  Hepatobiliary: Severe diffuse hepatic steatosis without focal hepatic parenchymal abnormality. Borderline to mild hepatomegaly. Gallbladder surgically absent. No biliary ductal dilation.  Pancreas: Normal in appearance without evidence of mass, ductal dilation, or inflammation.  Spleen: Normal in size and appearance. 2 foci of accessory splenic tissue medial to the lower pole just below the hilum.  Adrenals/Urinary Tract: Normal appearing adrenal glands. Focal scarring involving the upper pole of the left kidney. Kidneys otherwise normal in appearance. No urinary tract calculi. Normal-appearing decompressed urinary bladder.  Stomach/Bowel: Stomach normal in appearance for the degree of distention. Inspissated stool like material over a several cm segment of distal and terminal ileum. Small bowel otherwise normal in appearance. Very large colonic stool burden. Sigmoid colon tortuous and redundant. Normal decompressed appendix in the right upper pelvis.  Vascular/Lymphatic: No visible aortoiliofemoral atherosclerosis. Widely patent visceral arteries. No pathologic lymphadenopathy.  Reproductive: Normal appearing uterus. Endometrial fluid and/or thickening likely related to the menstrual phase. Approximate 3 cm simple cyst arising from the right ovary. No other adnexal masses.  Other: None.  Musculoskeletal: Degenerative disc disease and spondylosis throughout the visualized lower thoracic spine. Degenerative disc disease and spondylosis at T12-L1 and L5-S1. Facet degenerative changes at L4-5, left greater than right.  IMPRESSION: 1. Borderline to mild hepatomegaly. Severe diffuse hepatic steatosis without focal hepatic parenchymal abnormality. 2. Inspissated stool like material over a several cm segment of normal caliber distal and terminal ileum indicating stasis. 3. Very large colonic stool burden. No focal abnormalities  involving the colon. 4. Approximate 3 cm simple cyst arising from the right ovary. No imaging followup is necessary. This  recommendation follows ACR consensus guidelines: Mathews Paper of the ACR Incidental Findings Committee II on Adnexal Findings. J Am Coll Radiol 2013:10:675-681. 5. Focal scar involving the upper pole of the right kidney. No urinary tract calculi or obstruction.

## 2015-02-26 NOTE — Telephone Encounter (Signed)
PATIENT STATES WHEN SHE SAW DR. Milus GlazierLAUENSTEIN IN DEC. HE PRESCRIBED HER THE FLEX PEN FOR HER DIABETES. WHEN SHE WENT TO GET IT TODAY, THE PHARMACIST SAID DUE TO HER INSURANCE IT IS NOW ON A HIGHER TIER. IT WILL COST HER $80.00. THEY TOLD HER TO TRY LANTIS WHICH IS A LITTLE CHEAPER. SHE WOULD LIKE TO GET IT CHANGED AND SHE WILL ALSO NEED SYRINGES. BEST PHONE [phone] (CELL)  PHARMACY CHOICE IS [facility] ON [address]. MBC

## 2015-02-26 NOTE — Addendum Note (Signed)
Addended by: Eddie CandleLUCK, Alexandera Kuntzman L on: 02/26/2015 08:14 PM   Modules accepted: Orders

## 2015-02-27 LAB — HEPATIC FUNCTION PANEL
ALBUMIN: 4.2 g/dL (ref 3.6–5.1)
ALK PHOS: 93 U/L (ref 33–130)
ALT: 19 U/L (ref 6–29)
AST: 22 U/L (ref 10–35)
BILIRUBIN TOTAL: 0.2 mg/dL (ref 0.2–1.2)
Total Protein: 7.4 g/dL (ref 6.1–8.1)

## 2015-02-27 LAB — URINE CULTURE: Colony Count: 70000

## 2015-02-27 NOTE — Addendum Note (Signed)
Addended by: Abbe AmsterdamOPLAND, Dawayne Ohair C on: 02/27/2015 12:45 PM   Modules accepted: Orders

## 2015-02-27 NOTE — Telephone Encounter (Signed)
Pt missed her CB and would like another at [phone] also after 2

## 2015-02-27 NOTE — Telephone Encounter (Signed)
Can we Rx this for pt? 

## 2015-02-27 NOTE — Telephone Encounter (Signed)
So, she's wanting VIALS of Lantus rather than pens?  Please clarify, then ok to send with instructions: 30 units SQ each evening.  Also OK to send insulin syringes, needles.

## 2015-02-28 NOTE — Telephone Encounter (Signed)
Left message for pt to call back  °

## 2015-03-01 NOTE — Telephone Encounter (Signed)
Left message for pt to call back  °

## 2015-03-30 ENCOUNTER — Ambulatory Visit (INDEPENDENT_AMBULATORY_CARE_PROVIDER_SITE_OTHER): Payer: BLUE CROSS/BLUE SHIELD | Admitting: Emergency Medicine

## 2015-03-30 ENCOUNTER — Encounter: Payer: Self-pay | Admitting: Emergency Medicine

## 2015-03-30 VITALS — BP 102/68 | HR 66 | Temp 98.2°F | Resp 14 | Ht 66.5 in | Wt 262.2 lb

## 2015-03-30 DIAGNOSIS — E1165 Type 2 diabetes mellitus with hyperglycemia: Secondary | ICD-10-CM | POA: Diagnosis not present

## 2015-03-30 DIAGNOSIS — IMO0002 Reserved for concepts with insufficient information to code with codable children: Secondary | ICD-10-CM

## 2015-03-30 DIAGNOSIS — M545 Low back pain, unspecified: Secondary | ICD-10-CM

## 2015-03-30 DIAGNOSIS — E1140 Type 2 diabetes mellitus with diabetic neuropathy, unspecified: Secondary | ICD-10-CM

## 2015-03-30 LAB — POCT URINALYSIS DIP (MANUAL ENTRY)
Glucose, UA: NEGATIVE
Leukocytes, UA: NEGATIVE
Nitrite, UA: NEGATIVE
PH UA: 6
RBC UA: NEGATIVE
SPEC GRAV UA: 1.02
UROBILINOGEN UA: 1

## 2015-03-30 LAB — GLUCOSE, POCT (MANUAL RESULT ENTRY): POC GLUCOSE: 165 mg/dL — AB (ref 70–99)

## 2015-03-30 MED ORDER — GABAPENTIN 300 MG PO CAPS
300.0000 mg | ORAL_CAPSULE | Freq: Three times a day (TID) | ORAL | Status: DC
Start: 2015-03-30 — End: 2015-10-10

## 2015-03-30 MED ORDER — CLOTRIMAZOLE 10 MG MT TROC: 10.0000 mg | Freq: Every day | OROMUCOSAL | Status: DC

## 2015-03-30 MED ORDER — INSULIN GLARGINE 100 UNIT/ML SOLOSTAR PEN: PEN_INJECTOR | SUBCUTANEOUS | Status: DC

## 2015-03-30 NOTE — Progress Notes (Addendum)
By signing my name below, I, Stann Ore, attest that this documentation has been prepared under the direction and in the presence of Lesle Chris, MD. Electronically Signed: Stann Ore, Scribe. 03/30/2015 , 11:18 AM .  Patient was seen in room 13 .  Chief Complaint:  Chief Complaint  Patient presents with  . Medication change    victoza, no longer covered by insurance  . Back Pain    Right side, mid back, History of bronchitis per pt  . Medication Refill    gabapentin,     HPI: Shelby Scott is a 50 y.o. female who has history of bronchitis reports to [facility] today for right sided mid-back pain. She denies productive cough, wheezing or fever.   DM She wants to discuss medication change. She mentions that her insurance is raising Levemir to tier 3 so the cost will be high. She wants to change her medication. She was taking Levemir 30 units in the morning, and 40 units at night. She hasn't had insulin for a couple of weeks and her sugar has been running high (up to 200). She usually comes here for her DM. She was referred to endocrinologist but hasn't seen them yet. She also takes glimepiride and metformin.   Past Medical History  Diagnosis Date  . Anemia   . Anxiety   . Asthma   . Depression   . Diabetes mellitus without complication [facility])    Past Surgical History  Procedure Laterality Date  . Cholecystectomy     Social History   Social History  . Marital Status: Divorced    Spouse Name: N/A  . Number of Children: N/A  . Years of Education: N/A   Social History Main Topics  . Smoking status: Never Smoker   . Smokeless tobacco: None  . Alcohol Use: None  . Drug Use: None  . Sexual Activity: Not Asked   Other Topics Concern  . None   Social History Narrative   Family History  Problem Relation Age of Onset  . Hypertension Father   . Diabetes Brother   . Mental illness Brother   . Diabetes Maternal Grandmother   . Mental illness Maternal Grandfather   .  Diabetes Paternal Grandmother   . Hypertension Paternal Grandmother   . Heart disease Paternal Grandfather    No Known Allergies Prior to Admission medications   Medication Sig Start Date End Date Taking? Authorizing Provider  gabapentin (NEURONTIN) 300 MG capsule Take 1 capsule (300 mg total) by mouth 3 (three) times daily. 01/05/15  Yes Carmelina Dane, MD  glimepiride (AMARYL) 4 MG tablet Take 1 tablet (4 mg total) by mouth 2 (two) times daily. 02/11/15  Yes Carmelina Dane, MD  Insulin Detemir (LEVEMIR) 100 UNIT/ML Pen Inject 30 Units into the skin daily at 10 pm. 02/03/15  Yes Elvina Sidle, MD  Liraglutide (VICTOZA) 18 MG/3ML SOPN As discussed 01/05/15  Yes Carmelina Dane, MD  metFORMIN (GLUCOPHAGE) 1000 MG tablet Take 1 tablet (1,000 mg total) by mouth 2 (two) times daily with a meal. 01/05/15  Yes Carmelina Dane, MD  neomycin-polymyxin-hydrocortisone (CORTISPORIN) 3.5-10000-1 otic suspension Place 3 drops into both ears 4 (four) times daily. 09/25/14  Yes Carmelina Dane, MD  polyethylene glycol powder (GLYCOLAX/MIRALAX) powder Mix 17 gms in 8 oz of H2O bid for 5-7 days 02/26/15  Yes Collie Siad English, PA  triamcinolone (NASACORT AQ) 55 MCG/ACT AERO nasal inhaler Place 2 sprays into the nose daily. 09/25/14  Yes  Carmelina Dane, MD  venlafaxine XR (EFFEXOR XR) 150 MG 24 hr capsule Take 1 capsule (150 mg total) by mouth daily with breakfast. 01/05/15  Yes Carmelina Dane, MD  ciprofloxacin (CIPRO) 250 MG tablet Take 1 tablet (250 mg total) by mouth 2 (two) times daily. Patient not taking: Reported on 02/26/2015 02/02/15   Elvina Sidle, MD     ROS:  Constitutional: negative for fever, chills, night sweats, weight changes, or fatigue  HEENT: negative for vision changes, hearing loss, congestion, rhinorrhea, ST, epistaxis, or sinus pressure Cardiovascular: negative for chest pain or palpitations Respiratory: negative for hemoptysis, wheezing, shortness of breath, or  cough Abdominal: negative for abdominal pain, nausea, vomiting, diarrhea, or constipation Dermatological: negative for rash Musc: positive for back pain (right mid back) Neurologic: negative for headache, dizziness, or syncope All other systems reviewed and are otherwise negative with the exception to those above and in the HPI.  PHYSICAL EXAM: Filed Vitals:   03/30/15 1105  BP: 102/68  Pulse: 66  Temp: 98.2 F (36.8 C)  Resp: 14   Body mass index is 41.69 kg/(m^2).   General: Alert, no acute distress HEENT:  Normocephalic, atraumatic, oropharynx patent; Tiny pinpoint of hemorrhage over right TM Eye: [facility], [facility] Cardiovascular:  Regular rate and rhythm, no rubs murmurs or gallops.  No Carotid bruits, radial pulse intact. No pedal edema.  Respiratory: Clear to auscultation bilaterally.  No wheezes, rales, or rhonchi.  No cyanosis, no use of accessory musculature Abdominal: No organomegaly, abdomen is soft and non-tender, positive bowel sounds. No masses. Musculoskeletal: Gait intact. No edema, tenderness Skin: No rashes. Neurologic: Facial musculature symmetric. Psychiatric: Patient acts appropriately throughout our interaction.  Lymphatic: No cervical or submandibular lymphadenopathy Genitourinary/Anorectal: No acute findings  LABS: Results for orders placed or performed in visit on 03/30/15  POCT urinalysis dipstick  Result Value Ref Range   Color, UA yellow yellow   Clarity, UA clear clear   Glucose, UA negative negative   Bilirubin, UA small (A) negative   Ketones, POC UA small (15) (A) negative   Spec Grav, UA 1.020    Blood, UA negative negative   pH, UA 6.0    Protein Ur, POC =30 (A) negative   Urobilinogen, UA 1.0    Nitrite, UA Negative Negative   Leukocytes, UA Negative Negative  POCT glucose (manual entry)  Result Value Ref Range   POC Glucose 165 (A) 70 - 99 mg/dl   Meds ordered this encounter  Medications  . Insulin Glargine (LANTUS SOLOSTAR) 100  UNIT/ML Solostar Pen    Sig: Lantus dose is 30 units in the morning 40 units at bedtime.    Dispense:  8 pen    Refill:  11  . clotrimazole (MYCELEX) 10 MG troche    Sig: Take 1 tablet (10 mg total) by mouth 5 (five) times daily.    Dispense:  25 tablet    Refill:  o   EKG/XRAY:   Primary read interpreted by Dr. Cleta Alberts at [facility].   ASSESSMENT/PLAN:  I changed her from Levemir to Lantus at the same dose. She takes 40 at night and 30 in the morning. Glucose done today was 165 which she says is good for her. Her endocrinologist is Dr. Everardo All. Gabapentin was also refilled.I personally performed the services described in this documentation, which was scribed in my presence. The recorded information has been reviewed and is accurate.  Gross sideeffects, risk and benefits, and alternatives of medications d/w patient. Patient is aware  that all medications have potential sideeffects and we are unable to predict every sideeffect or drug-drug interaction that may occur.  Lesle Chris MD 03/30/2015 11:20 AM

## 2015-04-23 ENCOUNTER — Other Ambulatory Visit: Payer: Self-pay

## 2015-04-23 MED ORDER — GLIMEPIRIDE 4 MG PO TABS: 4.0000 mg | ORAL_TABLET | Freq: Two times a day (BID) | ORAL | Status: DC

## 2015-04-25 ENCOUNTER — Encounter: Payer: Self-pay | Admitting: Endocrinology

## 2015-04-25 ENCOUNTER — Ambulatory Visit (INDEPENDENT_AMBULATORY_CARE_PROVIDER_SITE_OTHER): Payer: BLUE CROSS/BLUE SHIELD | Admitting: Endocrinology

## 2015-04-25 VITALS — BP 118/82 | HR 93 | Temp 98.3°F | Ht 66.5 in | Wt 266.0 lb

## 2015-04-25 DIAGNOSIS — E1140 Type 2 diabetes mellitus with diabetic neuropathy, unspecified: Secondary | ICD-10-CM

## 2015-04-25 LAB — POCT GLYCOSYLATED HEMOGLOBIN (HGB A1C): Hemoglobin A1C: 7.1

## 2015-04-25 MED ORDER — INSULIN GLARGINE 100 UNIT/ML SOLOSTAR PEN: 70.0000 [IU] | PEN_INJECTOR | SUBCUTANEOUS | Status: DC

## 2015-04-25 NOTE — Patient Instructions (Addendum)
good diet and exercise significantly improve the control of your diabetes.  please let me know if you wish to be referred to a dietician.  high blood sugar is very risky to your health.  you should see an eye doctor and dentist every year.  It is very important to get all recommended vaccinations.  Please consider having weight loss surgery.  It is good for your health.  Here is some information about it.  If you decide to consider further, please call the phone number in the papers.   controlling your blood pressure and cholesterol drastically reduces the damage diabetes does to your body.  Those who smoke should quit.  please discuss these with your doctor.  check your blood sugar twice a day.  vary the time of day when you check, between before the 3 meals, and at bedtime.  also check if you have symptoms of your blood sugar being too high or too low.  please keep a record of the readings and bring it to your next appointment here (or you can bring the meter itself).  You can write it on any piece of paper.  please call us sooner if your blood sugar goes below 70, or if you have a lot of readings over 200.  For now, please stop taking the glimepiride, and: Try to slowly increase the victoza to the full amount.  Change the lantus to all in the morning.   Please come back for a follow-up appointment in 3 months.

## 2015-04-25 NOTE — Progress Notes (Signed)
Subjective:    Patient ID: Shelby Scott, female    DOB: [birth year], 50 y.o.   MRN: [id number]  HPI pt states DM was dx'ed in 2005; she has moderate neuropathy of the lower extremities; she is unaware of any associated chronic complications; she has been on insulin since 2013; pt says her diet and exercise are not good; she has never had GDM, pancreatitis, severe hypoglycemia or DKA.  She takes lantus, 2 oral meds, and victoza.  She says cbg's are in the high-100's.  There is no trend throughout the day.  Past Medical History  Diagnosis Date  . Anemia   . Anxiety   . Asthma   . Depression   . Diabetes mellitus without complication [facility])     Past Surgical History  Procedure Laterality Date  . Cholecystectomy      Social History   Social History  . Marital Status: Divorced    Spouse Name: N/A  . Number of Children: N/A  . Years of Education: N/A   Occupational History  . Not on file.   Social History Main Topics  . Smoking status: Never Smoker   . Smokeless tobacco: Not on file  . Alcohol Use: Not on file  . Drug Use: Not on file  . Sexual Activity: Not on file   Other Topics Concern  . Not on file   Social History Narrative    Current Outpatient Prescriptions on File Prior to Visit  Medication Sig Dispense Refill  . clotrimazole (MYCELEX) 10 MG troche Take 1 tablet (10 mg total) by mouth 5 (five) times daily. 25 tablet o  . gabapentin (NEURONTIN) 300 MG capsule Take 1 capsule (300 mg total) by mouth 3 (three) times daily. 90 capsule 2  . Liraglutide (VICTOZA) 18 MG/3ML SOPN As discussed (Patient taking differently: 0.6 mg. As discussed) 6 mL 2  . metFORMIN (GLUCOPHAGE) 1000 MG tablet Take 1 tablet (1,000 mg total) by mouth 2 (two) times daily with a meal. 120 tablet 2  . neomycin-polymyxin-hydrocortisone (CORTISPORIN) 3.5-10000-1 otic suspension Place 3 drops into both ears 4 (four) times daily. 10 mL 0  . polyethylene glycol powder (GLYCOLAX/MIRALAX) powder Mix 17  gms in 8 oz of H2O bid for 5-7 days 289 g 0  . triamcinolone (NASACORT AQ) 55 MCG/ACT AERO nasal inhaler Place 2 sprays into the nose daily. 1 Inhaler 12  . venlafaxine XR (EFFEXOR XR) 150 MG 24 hr capsule Take 1 capsule (150 mg total) by mouth daily with breakfast. 90 capsule 3   No current facility-administered medications on file prior to visit.    No Known Allergies  Family History  Problem Relation Age of Onset  . Hypertension Father   . Diabetes Brother   . Mental illness Brother   . Diabetes Maternal Grandmother   . Mental illness Maternal Grandfather   . Diabetes Paternal Grandmother   . Hypertension Paternal Grandmother   . Heart disease Paternal Grandfather     BP 118/82 mmHg  Pulse 93  Temp(Src) 98.3 F (36.8 C) (Oral)  Ht 5' 6.5" (1.689 m)  Wt 266 lb (120.657 kg)  BMI 42.30 kg/m2  SpO2 96%  LMP 02/28/2015  Review of Systems denies weight loss, blurry vision, chest pain, sob, n/v, urinary frequency, muscle cramps, excessive diaphoresis, cold intolerance, and easy bruising.  She has chronic headache, rhinorrhea, and depression.      Objective:   Physical Exam VS: see vs page GEN: no distress HEAD: head: no deformity eyes: no periorbital  swelling, no proptosis external nose and ears are normal mouth: no lesion seen NECK: supple, thyroid is not enlarged CHEST WALL: no deformity LUNGS:  Clear to auscultation.   CV: reg rate and rhythm, no murmur ABD: abdomen is soft, nontender.  no hepatosplenomegaly.  not distended.  no hernia MUSCULOSKELETAL: muscle bulk and strength are grossly normal.  no obvious joint swelling.  gait is normal and steady EXTEMITIES: no deformity.  no ulcer on the feet.  feet are of normal color and temp.  no edema PULSES: dorsalis pedis intact bilat.  no carotid bruit NEURO:  cn 2-12 grossly intact.   readily moves all 4's.  sensation is intact to touch on the feet SKIN:  Normal texture and temperature.  No rash or suspicious lesion is  visible.   NODES:  None palpable at the neck PSYCH: alert, well-oriented.  Does not appear anxious nor depressed.  A1c=7.1%  I have reviewed outside records, and summarized:  Pt was noted to have variable cbg's, and referred here.      Assessment & Plan:  DM: he needs some simplification in his therapy.  We'll refine the insulin schedule later Obesity, new to me.   Patient is advised the following: Patient Instructions  good diet and exercise significantly improve the control of your diabetes.  please let me know if you wish to be referred to a dietician.  high blood sugar is very risky to your health.  you should see an eye doctor and dentist every year.  It is very important to get all recommended vaccinations.  Please consider having weight loss surgery.  It is good for your health.  Here is some information about it.  If you decide to consider further, please call the phone number in the papers.   controlling your blood pressure and cholesterol drastically reduces the damage diabetes does to your body.  Those who smoke should quit.  please discuss these with your doctor.  check your blood sugar twice a day.  vary the time of day when you check, between before the 3 meals, and at bedtime.  also check if you have symptoms of your blood sugar being too high or too low.  please keep a record of the readings and bring it to your next appointment here (or you can bring the meter itself).  You can write it on any piece of paper.  please call us sooner if your blood sugar goes below 70, or if you have a lot of readings over 200.  For now, please stop taking the glimepiride, and: Try to slowly increase the victoza to the full amount.  Change the lantus to all in the morning.   Please come back for a follow-up appointment in 3 months.

## 2015-06-26 DIAGNOSIS — E1140 Type 2 diabetes mellitus with diabetic neuropathy, unspecified: Secondary | ICD-10-CM | POA: Diagnosis not present

## 2015-06-26 DIAGNOSIS — F329 Major depressive disorder, single episode, unspecified: Secondary | ICD-10-CM | POA: Diagnosis not present

## 2015-06-28 ENCOUNTER — Other Ambulatory Visit (HOSPITAL_COMMUNITY): Payer: Self-pay | Admitting: General Surgery

## 2015-07-24 ENCOUNTER — Encounter: Payer: BLUE CROSS/BLUE SHIELD | Attending: General Surgery | Admitting: Dietician

## 2015-07-24 ENCOUNTER — Encounter: Payer: Self-pay | Admitting: Dietician

## 2015-07-24 DIAGNOSIS — Z01818 Encounter for other preprocedural examination: Secondary | ICD-10-CM | POA: Diagnosis not present

## 2015-07-24 NOTE — Patient Instructions (Signed)
Follow Pre-Op Goals Try Protein Shakes Call [facility] at [phone] when surgery is scheduled to enroll in Pre-Op Class  Things to remember:  Please always be honest with us. We want to support you!  If you have any questions or concerns in between appointments, please call or email Liz, Leslie, or Laurie.  The diet after surgery will be high protein and low in carbohydrate.  Vitamins and calcium need to be taken for the rest of your life.  Feel free to include support people in any classes or appointments.   Supplement recommendations:  Complete" Multivitamin: Sleeve Gastrectomy and RYGB patients take a double dose of MVI. LAGB patients take single dose as it is written on the package. Vitamin must be liquid or chewable but not gummy. Examples of these include Flintstones Complete and Centrum Complete. If the vitamin is bariatric-specific, take 1 dose as it is already formulated for bariatric surgery patients. Examples of these are Bariatric Advantage, Celebrate, and Wellesse. These can be found at the [facility] and/or online.     Calcium citrate: 1500 mg/day of Calcium citrate (also chewable or liquid) is recommended for all procedures. The body is only able to absorb 500-600 mg of Calcium at one time so 3 daily doses of 500 mg are recommended. Calcium doses must be taken a minimum of 2 hours apart. Additionally, Calcium must be taken 2 hours apart from iron-containing MVI. Examples of brands include Celebrate, Bariatric Advantage, and Wellesse. These brands must be purchased online or at the [facility]. Citracal Petites is the only Calcium citrate supplement found in general grocery stores and pharmacies. This is in tablet form and may be recommended for patients who do not tolerate chewable Calcium.  Continued or added Vitamin D supplementation based on individual needs.    Vitamin B12: 300-500 mcg/day for Sleeve Gastrectomy and RYGB. Optional for  LAGB patients as stomach remains fully intact. Must be taken intramuscularly, sublingually, or inhaled nasally. Oral route is not recommended. 

## 2015-07-24 NOTE — Progress Notes (Signed)
  Pre-Op Assessment Visit:  Pre-Operative RYGB Surgery  Medical Nutrition Therapy:  Appt start time: 0800   End time:  0845.  Patient was seen on 07/24/2015 for Pre-Operative Nutrition Assessment. Assessment and letter of approval [facility] coordinator on 07/24/2015.   Preferred Learning Style:   No preference indicated   Learning Readiness:   Ready  Handouts given during visit include:  Pre-Op Goals Bariatric Surgery Protein Shakes   During the appointment today the following Pre-Op Goals were reviewed with the patient: Maintain or lose weight as instructed by your surgeon Make healthy food choices Begin to limit portion sizes Limited concentrated sugars and fried foods Keep fat/sugar in the single digits per serving on   food labels Practice CHEWING your food  (aim for 30 chews per bite or until applesauce consistency) Practice not drinking 15 minutes before, during, and 30 minutes after each meal/snack Avoid all carbonated beverages  Avoid/limit caffeinated beverages  Avoid all sugar-sweetened beverages Consume 3 meals per day; eat every 3-5 hours Make a list of non-food related activities Aim for 64-100 ounces of FLUID daily  Aim for at least 60-80 grams of PROTEIN daily Look for a liquid protein source that contain ?15 g protein and ?5 g carbohydrate  (ex: shakes, drinks, shots)  Patient-Centered Goals: Goals: Would like to be able to run, get into old clothes, look nice, feel better, improve diabetes 9 confidence/10 importance scale 1-10  Demonstrated degree of understanding via:  Teach Back  Teaching Method Utilized:  Visual Auditory Hands on  Barriers to learning/adherence to lifestyle change: none  Patient to call the [facility] to enroll in Pre-Op and Post-Op Nutrition Education when surgery date is scheduled.

## 2015-07-25 ENCOUNTER — Ambulatory Visit (HOSPITAL_COMMUNITY)
Admission: RE | Admit: 2015-07-25 | Discharge: 2015-07-25 | Disposition: A | Discharge status: Home / Self Care | Payer: BLUE CROSS/BLUE SHIELD | Source: Ambulatory Visit | Attending: General Surgery | Admitting: General Surgery

## 2015-07-25 ENCOUNTER — Other Ambulatory Visit: Payer: Self-pay

## 2015-07-25 DIAGNOSIS — Z9049 Acquired absence of other specified parts of digestive tract: Secondary | ICD-10-CM | POA: Diagnosis not present

## 2015-07-25 DIAGNOSIS — I517 Cardiomegaly: Secondary | ICD-10-CM | POA: Diagnosis not present

## 2015-07-25 DIAGNOSIS — Z01818 Encounter for other preprocedural examination: Secondary | ICD-10-CM | POA: Diagnosis not present

## 2015-07-26 ENCOUNTER — Ambulatory Visit (INDEPENDENT_AMBULATORY_CARE_PROVIDER_SITE_OTHER): Payer: BLUE CROSS/BLUE SHIELD | Admitting: Endocrinology

## 2015-07-26 ENCOUNTER — Encounter: Payer: Self-pay | Admitting: Endocrinology

## 2015-07-26 ENCOUNTER — Other Ambulatory Visit: Payer: Self-pay

## 2015-07-26 VITALS — BP 146/93 | HR 96 | Temp 98.0°F | Ht 66.5 in | Wt 264.0 lb

## 2015-07-26 DIAGNOSIS — E1140 Type 2 diabetes mellitus with diabetic neuropathy, unspecified: Secondary | ICD-10-CM

## 2015-07-26 DIAGNOSIS — Z794 Long term (current) use of insulin: Secondary | ICD-10-CM | POA: Diagnosis not present

## 2015-07-26 MED ORDER — GLUCOSE BLOOD VI STRP
1.0000 | ORAL_STRIP | Freq: Two times a day (BID) | Status: DC
Start: 2015-07-26 — End: 2016-09-08

## 2015-07-26 MED ORDER — INSULIN GLARGINE 100 UNIT/ML SOLOSTAR PEN: 40.0000 [IU] | PEN_INJECTOR | Freq: Every day | SUBCUTANEOUS | Status: DC

## 2015-07-26 MED ORDER — INSULIN ASPART 100 UNIT/ML FLEXPEN: PEN_INJECTOR | SUBCUTANEOUS | Status: DC

## 2015-07-26 MED ORDER — LIRAGLUTIDE 18 MG/3ML ~~LOC~~ SOPN: 1.8000 mg | PEN_INJECTOR | Freq: Every day | SUBCUTANEOUS | Status: DC

## 2015-07-26 MED ORDER — INSULIN LISPRO 100 UNIT/ML (KWIKPEN): 10.0000 [IU] | PEN_INJECTOR | Freq: Three times a day (TID) | SUBCUTANEOUS | Status: DC

## 2015-07-26 MED ORDER — METFORMIN HCL ER 500 MG PO TB24: 2000.0000 mg | ORAL_TABLET | Freq: Every day | ORAL | Status: DC

## 2015-07-26 NOTE — Patient Instructions (Addendum)
Please continue to try to push to amount of victoza, and: change the metformin to extended-release.  i hope this will help your nausea, thus enabling you to increase the victoza, and: Decrease the lantus to 40 units daily, and: Add humalog 10 units 3 times a day (just before each meal) check your blood sugar twice a day.  vary the time of day when you check, between before the 3 meals, and at bedtime.  also check if you have symptoms of your blood sugar being too high or too low.  please keep a record of the readings and bring it to your next appointment here (or you can bring the meter itself).  You can write it on any piece of paper.  please call us sooner if your blood sugar goes below 70, or if you have a lot of readings over 200. Please come back for a follow-up appointment in 3 months.

## 2015-07-26 NOTE — Progress Notes (Signed)
Subjective:    Patient ID: Shelby Scott, female    DOB: 04-01-1965, 50 y.o.   MRN: [id number]  HPI Pt returns for f/u of diabetes mellitus: DM type: Insulin-requiring type 2 Dx'ed: 2005 Complications: polyneuropathy Therapy: insulin since 2013 GDM: never DKA: never Severe hypoglycemia: never. Pancreatitis: never Other: she takes qd insulin, for now. Interval history: Pt takes only 0.6 mg/d of victoza, due to nausea.  no cbg record, but states cbg's vary from 112-190.  It is in general higher as the day goes on.  She is pursuing the weight loss surgery.  Past Medical History  Diagnosis Date  . Anemia   . Anxiety   . Asthma   . Depression   . Diabetes mellitus without complication [facility])     Past Surgical History  Procedure Laterality Date  . Cholecystectomy      Social History   Social History  . Marital Status: Divorced    Spouse Name: N/A  . Number of Children: N/A  . Years of Education: N/A   Occupational History  . Not on file.   Social History Main Topics  . Smoking status: Never Smoker   . Smokeless tobacco: Not on file  . Alcohol Use: Not on file  . Drug Use: Not on file  . Sexual Activity: Not on file   Other Topics Concern  . Not on file   Social History Narrative    Current Outpatient Prescriptions on File Prior to Visit  Medication Sig Dispense Refill  . ALPHA-LIPOIC ACID PO Take by mouth 2 (two) times daily.     . Biotin 1000 MCG tablet Take 1,000 mcg by mouth daily.     . clotrimazole (MYCELEX) 10 MG troche Take 1 tablet (10 mg total) by mouth 5 (five) times daily. (Patient taking differently: Take 10 mg by mouth 2 (two) times daily. ) 25 tablet o  . gabapentin (NEURONTIN) 300 MG capsule Take 1 capsule (300 mg total) by mouth 3 (three) times daily. 90 capsule 2  . Multiple Vitamins-Minerals (MULTIVITAMIN WITH MINERALS) tablet Take 1 tablet by mouth daily.    Marland Kitchen. neomycin-polymyxin-hydrocortisone (CORTISPORIN) 3.5-10000-1 otic suspension Place 3  drops into both ears 4 (four) times daily. 10 mL 0  . polyethylene glycol powder (GLYCOLAX/MIRALAX) powder Mix 17 gms in 8 oz of H2O bid for 5-7 days (Patient taking differently: PRN) 289 g 0  . triamcinolone (NASACORT AQ) 55 MCG/ACT AERO nasal inhaler Place 2 sprays into the nose daily. (Patient taking differently: Place 2 sprays into the nose as needed. ) 1 Inhaler 12  . venlafaxine XR (EFFEXOR XR) 150 MG 24 hr capsule Take 1 capsule (150 mg total) by mouth daily with breakfast. 90 capsule 3   No current facility-administered medications on file prior to visit.    No Known Allergies  Family History  Problem Relation Age of Onset  . Hypertension Father   . Diabetes Brother   . Mental illness Brother   . Diabetes Maternal Grandmother   . Mental illness Maternal Grandfather   . Diabetes Paternal Grandmother   . Hypertension Paternal Grandmother   . Heart disease Paternal Grandfather     BP 146/93 mmHg  Pulse 96  Temp(Src) 98 F (36.7 C) (Oral)  Ht 5' 6.5" (1.689 m)  Wt 264 lb (119.75 kg)  BMI 41.98 kg/m2  SpO2 94%  LMP 07/10/2015   Review of Systems She denies hypoglycemia.     Objective:   Physical Exam VITAL SIGNS:  See vs  page GENERAL: no distress Pulses: dorsalis pedis intact bilat.   MSK: no deformity of the feet CV: 1+ bilat leg edema Skin:  no ulcer on the feet.  normal color and temp on the feet. Neuro: sensation is intact to touch on the feet, but decreased from normal   outside test results are reviewed: A1c=7.1%    Assessment & Plan:  Insulin-requiring type 2 DM: Based on the pattern of her cbg's, she needs some adjustment in her therapy.  Nausea: new: possibly due to metformin.  This is limiting victoza rx.    Patient is advised the following: Patient Instructions  Please continue to try to push to amount of victoza, and: change the metformin to extended-release.  i hope this will help your nausea, thus enabling you to increase the victoza,  and: Decrease the lantus to 40 units daily, and: Add humalog 10 units 3 times a day (just before each meal) check your blood sugar twice a day.  vary the time of day when you check, between before the 3 meals, and at bedtime.  also check if you have symptoms of your blood sugar being too high or too low.  please keep a record of the readings and bring it to your next appointment here (or you can bring the meter itself).  You can write it on any piece of paper.  please call us sooner if your blood sugar goes below 70, or if you have a lot of readings over 200. Please come back for a follow-up appointment in 3 months.       Romero Belling, MD

## 2015-08-21 ENCOUNTER — Encounter: Payer: BLUE CROSS/BLUE SHIELD | Attending: General Surgery | Admitting: Dietician

## 2015-08-21 ENCOUNTER — Encounter: Payer: Self-pay | Admitting: Dietician

## 2015-08-21 DIAGNOSIS — Z01818 Encounter for other preprocedural examination: Secondary | ICD-10-CM | POA: Insufficient documentation

## 2015-08-21 NOTE — Patient Instructions (Addendum)
Try using the protein shake for a coffee creamer. Plan to do some exercise on Saturdays (try to get mom involved). Or think about going for a walk before work.  Continue to work on chewing well and practice waiting 30 minutes to drink after meal.

## 2015-08-21 NOTE — Progress Notes (Signed)
  6 Months Supervised Weight Loss Visit:   Pre-Operative RYGB Surgery  Medical Nutrition Therapy:  Appt start time: 0800 end time:  0815.  Primary concerns today: Supervised Weight Loss Visit. Returns with a 4 lb weight loss since last month. Has been limiting carbs. Drinking protein shakes (Atkins) for breakfast. Cannot eat breakfast food since they "make her sick". Eating 3 meals per day. Still having regular coffee. Feels like she needs to drink more fluid throughout the day. Has started to work on chewing well which is hard since she is in a hurry. Doesn't feel like not drinking during meals will be a problem though waiting 30 minutes after may be an issues. Not having any fried foods since it makes her feel sick except for fried chicken. Takes stairs when she can and walks around the office but she is not exercising regularly.   Weight: 261.4 lbs BMI: 42.8  Preferred Learning Style:   No preference indicated   Learning Readiness:   Ready   24-hr recall: B (AM): Atkins protein shake Snk (AM): none L (PM): salad with cheese and bacon bits or peanut butter sandwich sometimes with banana Snk (PM): protein bar   D (PM): chicken or soup Snk (PM): apple  Beverages: coffee with sugar free creamer, crystal light, unsweet tea   Medications: see list   Recent physical activity:  None recently  Progress Towards Goal(s):  In progress.  Handouts given during visit include:  none   Nutritional Diagnosis:  [place]-3.3 Obesity related to past poor dietary habits and physical inactivity as evidenced by patient attending supervised weight loss for insurance approval of bariatric surgery.    Intervention:  Nutrition counseling provided. Plan: Try using the protein shake for a coffee creamer. Plan to do some exercise on Saturdays (try to get mom involved). Or think about going for a walk before work.  Continue to work on chewing well and practice waiting 30 minutes to drink after  meal.  Teaching Method Utilized:  Visual Auditory Hands on  Barriers to learning/adherence to lifestyle change: none  Demonstrated degree of understanding via:  Teach Back   Monitoring/Evaluation:  Dietary intake, exercise, and body weight. Follow up in 1 months for 6 month supervised weight loss visit.

## 2015-09-14 ENCOUNTER — Ambulatory Visit (INDEPENDENT_AMBULATORY_CARE_PROVIDER_SITE_OTHER): Payer: BLUE CROSS/BLUE SHIELD | Admitting: Physician Assistant

## 2015-09-14 ENCOUNTER — Ambulatory Visit (INDEPENDENT_AMBULATORY_CARE_PROVIDER_SITE_OTHER): Payer: BLUE CROSS/BLUE SHIELD

## 2015-09-14 VITALS — BP 140/90 | HR 88 | Temp 98.1°F | Resp 20 | Ht 66.5 in | Wt 267.0 lb

## 2015-09-14 DIAGNOSIS — M25562 Pain in left knee: Secondary | ICD-10-CM | POA: Diagnosis not present

## 2015-09-14 DIAGNOSIS — S8992XA Unspecified injury of left lower leg, initial encounter: Secondary | ICD-10-CM | POA: Diagnosis not present

## 2015-09-14 MED ORDER — CYCLOBENZAPRINE HCL 5 MG PO TABS: 5.0000 mg | ORAL_TABLET | Freq: Three times a day (TID) | ORAL | Status: DC | PRN

## 2015-09-14 MED ORDER — MELOXICAM 7.5 MG PO TABS: 7.5000 mg | ORAL_TABLET | Freq: Every day | ORAL | Status: DC
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
## 2015-09-14 NOTE — Patient Instructions (Addendum)
IF you received an x-ray today, you will receive an invoice from [facility]. Please contact [facility] at [phone] with questions or concerns regarding your invoice.   IF you received labwork today, you will receive an invoice from [employer]. Please contact [facility] at [phone] with questions or concerns regarding your invoice.   Our billing staff will not be able to assist you with questions regarding bills from these companies.  You will be contacted with the lab results as soon as they are available. The fastest way to get your results is to activate your My Chart account. Instructions are located on the last page of this paperwork. If you have not heard from [place] regarding the results in 2 weeks, please contact this office.     We recommend that you schedule a mammogram for breast cancer screening. Typically, you do not need a referral to do this. Please contact a local imaging center to schedule your mammogram.  [facility] - [phone]  *ask for the Radiology Department [facility]) - [phone] or [phone]  [facility] - [phone] [facility] - [phone] [facility] - [phone]  *ask for the Radiology Department [facility] - [phone]  *ask for the Radiology Department [facility] - [phone]  *ask for the Mammography Department [facility] - [phone]  Please perform shoulder stretches three times per day.  Pick three pictures.  Repeat it movement 5 times.  If it says hold, hold for 10 seconds.   Please ice the knee three times per day for 15 minutes.   Do not take the mobic with naproxen or ibuprofen.  You are able to do tylenol.  Shoulder Range of Motion Exercises Shoulder range of motion (ROM) exercises are designed to keep the shoulder moving freely. They are often recommended  for people who have shoulder pain. MOVEMENT EXERCISE When you are able, do this exercise 5-6 days per week, or as told by your health care provider. Work toward doing 2 sets of 10 swings. Pendulum Exercise How To Do This Exercise Lying Down 1. Lie face-down on a bed with your abdomen close to the side of the bed. 2. Let your arm hang over the side of the bed. 3. Relax your shoulder, arm, and hand. 4. Slowly and gently swing your arm forward and back. Do not use your neck muscles to swing your arm. They should be relaxed. If you are struggling to swing your arm, have someone gently swing it for you. When you do this exercise for the first time, swing your arm at a 15 degree angle for 15 seconds, or swing your arm 10 times. As pain lessens over time, increase the angle of the swing to 30-45 degrees. 5. Repeat steps 1-4 with the other arm. How To Do This Exercise While Standing 1. Stand next to a sturdy chair or table and hold on to it with your hand.  Bend forward at the waist.  Bend your knees slightly.  Relax your other arm and let it hang limp.  Relax the shoulder blade of the arm that is hanging and let it drop.  While keeping your shoulder relaxed, use body motion to swing your arm in small circles. The first time you do this exercise, swing your arm for about 30 seconds or 10 times. When you do it next time, swing your arm for a little  longer.  Stand up tall and relax.  Repeat steps 1-7, this time changing the direction of the circles. 2. Repeat steps 1-8 with the other arm. STRETCHING EXERCISES Do these exercises 3-4 times per day on 5-6 days per week or as told by your health care provider. Work toward holding the stretch for 20 seconds. Stretching Exercise 1 1. Lift your arm straight out in front of you. 2. Bend your arm 90 degrees at the elbow (right angle) so your forearm goes across your body and looks like the letter "L." 3. Use your other arm to gently pull the elbow  forward and across your body. 4. Repeat steps 1-3 with the other arm. Stretching Exercise 2 You will need a towel or rope for this exercise. 1. Bend one arm behind your back with the palm facing outward. 2. Hold a towel with your other hand. 3. Reach the arm that holds the towel above your head, and bend that arm at the elbow. Your wrist should be behind your neck. 4. Use your free hand to grab the free end of the towel. 5. With the higher hand, gently pull the towel up behind you. 6. With the lower hand, pull the towel down behind you. 7. Repeat steps 1-6 with the other arm. STRENGTHENING EXERCISES Do each of these exercises at four different times of day (sessions) every day or as told by your health care provider. To begin with, repeat each exercise 5 times (repetitions). Work toward doing 3 sets of 12 repetitions or as told by your health care provider. Strengthening Exercise 1 You will need a light weight for this activity. As you grow stronger, you may use a heavier weight. 1. Standing with a weight in your hand, lift your arm straight out to the side until it is at the same height as your shoulder. 2. Bend your arm at 90 degrees so that your fingers are pointing to the ceiling. 3. Slowly raise your hand until your arm is straight up in the air. 4. Repeat steps 1-3 with the other arm. Strengthening Exercise 2 You will need a light weight for this activity. As you grow stronger, you may use a heavier weight. 1. Standing with a weight in your hand, gradually move your straight arm in an arc, starting at your side, then out in front of you, then straight up over your head. 2. Gradually move your other arm in an arc, starting at your side, then out in front of you, then straight up over your head. 3. Repeat steps 1-2 with the other arm. Strengthening Exercise 3 You will need an elastic band for this activity. As you grow stronger, gradually increase the size of the bands or increase the  number of bands that you use at one time. 1. While standing, hold an elastic band in one hand and raise that arm up in the air. 2. With your other hand, pull down the band until that hand is by your side. 3. Repeat steps 1-2 with the other arm.   This information is not intended to replace advice given to you by your health care provider. Make sure you discuss any questions you have with your health care provider.   Document Released: 11/08/2002 Document Revised: 06/26/2014 Document Reviewed: 02/05/2014 Elsevier Interactive Patient Education [employer]

## 2015-09-16 ENCOUNTER — Ambulatory Visit: Payer: BLUE CROSS/BLUE SHIELD | Admitting: Dietician

## 2015-09-16 ENCOUNTER — Telehealth: Payer: Self-pay

## 2015-09-16 MED ORDER — ALBUTEROL SULFATE HFA 108 (90 BASE) MCG/ACT IN AERS: 2.0000 | INHALATION_SPRAY | Freq: Four times a day (QID) | RESPIRATORY_TRACT | 0 refills | Status: DC | PRN

## 2015-09-16 NOTE — Telephone Encounter (Signed)
Rx sent 

## 2015-09-17 ENCOUNTER — Encounter: Payer: BLUE CROSS/BLUE SHIELD | Attending: General Surgery | Admitting: Dietician

## 2015-09-17 ENCOUNTER — Encounter: Payer: Self-pay | Admitting: Dietician

## 2015-09-17 DIAGNOSIS — Z01818 Encounter for other preprocedural examination: Secondary | ICD-10-CM | POA: Insufficient documentation

## 2015-09-17 NOTE — Patient Instructions (Addendum)
Plan to do some exercise on Saturdays (try to get mom involved). Or think about going for a walk before work.  Continue to work on chewing well. Try adding milk to coffee instead of creamer.  Plan to eat something in the morning with protein or have a protein shake.

## 2015-09-17 NOTE — Progress Notes (Signed)
  6 Months Supervised Weight Loss Visit:   Pre-Operative RYGB Surgery  Medical Nutrition Therapy:  Appt start time: 0745 end time:  0800  Primary concerns today: Supervised Weight Loss Visit. Returns with a 5 lb weight gain since last month. Started back to having salad after not having them for a while. Not having breakfast most of them. Drinks a lot of regular coffee. Not having as many pieces of dark chocolate. Had some no sugar added ice cream.    Feels like she is better about getting in her fluid. Has been working on chewing well which is hard since she is in a hurry. Has not been drinking during meals and started waiting 30 minutes after meals to drink.    Weight: 266.3 lbs BMI: 43.6  Preferred Learning Style:   No preference indicated   Learning Readiness:   Ready   24-hr recall: B (AM): none most of the time Atkins protein shake Snk (AM): none L (PM): salad with cheese and bacon bits with yogurt or peanut butter sandwich sometimes with banana Snk (PM): none D (PM): chicken sometimes with vegetables and starch Snk (PM): apple  Beverages: 32 oz coffee with sugar free creamer, crystal light, unsweet tea, lemon water   Medications: see list   Recent physical activity:  None recently  Progress Towards Goal(s):  In progress.  Handouts given during visit include:  none   Nutritional Diagnosis:  [place]-3.3 Obesity related to past poor dietary habits and physical inactivity as evidenced by patient attending supervised weight loss for insurance approval of bariatric surgery.    Intervention:  Nutrition counseling provided. Plan: Plan to do some exercise on Saturdays (try to get mom involved). Or think about going for a walk before work.  Continue to work on chewing well. Try adding milk to coffee instead of creamer.  Plan to eat something in the morning with protein or have a protein shake.  Teaching Method Utilized:  Visual Auditory Hands on  Barriers to  learning/adherence to lifestyle change: none  Demonstrated degree of understanding via:  Teach Back   Monitoring/Evaluation:  Dietary intake, exercise, and body weight. Follow up in 1 months for 6 month supervised weight loss visit.

## 2015-10-08 DIAGNOSIS — L700 Acne vulgaris: Secondary | ICD-10-CM | POA: Diagnosis not present

## 2015-10-08 DIAGNOSIS — L219 Seborrheic dermatitis, unspecified: Secondary | ICD-10-CM | POA: Diagnosis not present

## 2015-10-10 ENCOUNTER — Other Ambulatory Visit: Payer: Self-pay | Admitting: Emergency Medicine

## 2015-10-10 ENCOUNTER — Encounter: Payer: BLUE CROSS/BLUE SHIELD | Attending: General Surgery | Admitting: Dietician

## 2015-10-10 ENCOUNTER — Encounter: Payer: Self-pay | Admitting: Dietician

## 2015-10-10 DIAGNOSIS — Z01818 Encounter for other preprocedural examination: Secondary | ICD-10-CM | POA: Diagnosis not present

## 2015-10-10 NOTE — Patient Instructions (Addendum)
Try to go skating on Sunday nights Continue to work on limiting carbs. Continue to work on chewing well. Continue adding milk to coffee instead of creamer.
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
## 2015-10-12 ENCOUNTER — Other Ambulatory Visit: Payer: Self-pay | Admitting: Emergency Medicine

## 2015-10-15 ENCOUNTER — Other Ambulatory Visit: Payer: Self-pay | Admitting: *Deleted

## 2015-10-16 ENCOUNTER — Other Ambulatory Visit: Payer: Self-pay | Admitting: Physician Assistant

## 2015-10-16 DIAGNOSIS — H60333 Swimmer's ear, bilateral: Secondary | ICD-10-CM | POA: Diagnosis not present

## 2015-10-16 DIAGNOSIS — H9313 Tinnitus, bilateral: Secondary | ICD-10-CM | POA: Diagnosis not present

## 2015-10-16 DIAGNOSIS — H903 Sensorineural hearing loss, bilateral: Secondary | ICD-10-CM | POA: Diagnosis not present

## 2015-10-18 ENCOUNTER — Other Ambulatory Visit: Payer: Self-pay | Admitting: Physician Assistant

## 2015-10-19 NOTE — Telephone Encounter (Signed)
Shelby Scott are we refilling the inhaler.  I do not see any mention of it in the 7/22 visit or previous vists

## 2015-10-20 ENCOUNTER — Emergency Department (HOSPITAL_COMMUNITY)
Admission: EM | Admit: 2015-10-20 | Discharge: 2015-10-20 | Disposition: A | Discharge status: Home / Self Care | Payer: BLUE CROSS/BLUE SHIELD | Attending: Emergency Medicine | Admitting: Emergency Medicine

## 2015-10-20 ENCOUNTER — Encounter (HOSPITAL_COMMUNITY): Payer: Self-pay | Admitting: *Deleted

## 2015-10-20 DIAGNOSIS — R109 Unspecified abdominal pain: Secondary | ICD-10-CM | POA: Diagnosis present

## 2015-10-20 DIAGNOSIS — J45909 Unspecified asthma, uncomplicated: Secondary | ICD-10-CM | POA: Diagnosis not present

## 2015-10-20 DIAGNOSIS — Z7984 Long term (current) use of oral hypoglycemic drugs: Secondary | ICD-10-CM | POA: Insufficient documentation

## 2015-10-20 DIAGNOSIS — R002 Palpitations: Secondary | ICD-10-CM | POA: Diagnosis not present

## 2015-10-20 DIAGNOSIS — E119 Type 2 diabetes mellitus without complications: Secondary | ICD-10-CM | POA: Diagnosis not present

## 2015-10-20 DIAGNOSIS — M791 Myalgia: Secondary | ICD-10-CM | POA: Diagnosis not present

## 2015-10-20 DIAGNOSIS — Z79899 Other long term (current) drug therapy: Secondary | ICD-10-CM | POA: Insufficient documentation

## 2015-10-20 DIAGNOSIS — Z794 Long term (current) use of insulin: Secondary | ICD-10-CM | POA: Diagnosis not present

## 2015-10-20 DIAGNOSIS — M546 Pain in thoracic spine: Secondary | ICD-10-CM | POA: Diagnosis not present

## 2015-10-20 MED ORDER — LIDOCAINE 5 % EX PTCH: 1.0000 | MEDICATED_PATCH | CUTANEOUS | 0 refills | Status: DC

## 2015-10-20 MED ORDER — NAPROXEN 500 MG PO TABS: 500.0000 mg | ORAL_TABLET | Freq: Two times a day (BID) | ORAL | 0 refills | Status: DC

## 2015-10-20 MED ORDER — METHOCARBAMOL 500 MG PO TABS: 500.0000 mg | ORAL_TABLET | Freq: Two times a day (BID) | ORAL | 0 refills | Status: DC
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
## 2015-10-20 NOTE — Discharge Instructions (Signed)
You have been seen today for back and rib pain.  Take it easy, but do not lay around too much as this may make the stiffness worse. Take 500 mg of naproxen every 12 hours or 800 mg of ibuprofen every 8 hours for the next 3 days. Take these medications with food to avoid upset stomach. Robaxin is a muscle relaxer and may help loosen stiff muscles. Do not take the Robaxin while driving or performing other dangerous activities. Follow up with PCP for chronic management of this issue. Return to ED as needed.
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
## 2015-10-25 ENCOUNTER — Ambulatory Visit: Payer: BLUE CROSS/BLUE SHIELD | Admitting: Endocrinology

## 2015-11-06 ENCOUNTER — Other Ambulatory Visit: Payer: Self-pay | Admitting: Emergency Medicine

## 2015-11-08 ENCOUNTER — Encounter: Payer: BLUE CROSS/BLUE SHIELD | Attending: General Surgery | Admitting: Dietician

## 2015-11-08 ENCOUNTER — Encounter: Payer: Self-pay | Admitting: Dietician

## 2015-11-08 DIAGNOSIS — Z01818 Encounter for other preprocedural examination: Secondary | ICD-10-CM | POA: Insufficient documentation

## 2015-11-08 NOTE — Progress Notes (Signed)
  6 Months Supervised Weight Loss Visit:   Pre-Operative RYGB Surgery  Medical Nutrition Therapy:  Appt start time: 0800 end time:  0815  Primary concerns today: Supervised Weight Loss Visit.  Started walking during lunch. Feels like she is chewing better. Having more milk in coffee instead of creamer. Still drinking more water.   Has lost half a pound in the last month. Feels like she has been retaining water and plans to discuss this with her doctor. Has been eating more vegetables and low carb yogurt. Practicing chewing well but sometimes forgets. Tried Premier shake as her coffee creamer and did not like it. However, she likes the protein shakes by themselves.   Weight: 266.7 lbs BMI: 43.7  Preferred Learning Style:   No preference indicated   Learning Readiness:   Ready  24-hr recall: B (AM): bacon/meat or Atkins protein shake Snk (AM): none L (PM): salad with cheese and bacon bits with yogurt or peanut butter sandwich sometimes with banana Snk (PM): none D (PM): chicken sometimes with vegetables and starch Snk (PM): apple  Beverages: 32 oz coffee with sugar free creamer, crystal light, unsweet tea, lemon water   Medications: see list   Recent physical activity:  Walking inside at lunch time   Progress Towards Goal(s):  In progress.  Handouts given during visit include:  none   Nutritional Diagnosis:  [place]-3.3 Obesity related to past poor dietary habits and physical inactivity as evidenced by patient attending supervised weight loss for insurance approval of bariatric surgery.    Intervention:  Nutrition counseling provided. Plan: Try to go skating on Sunday nights Continue to work on limiting carbs. Continue to work on chewing well. Continue adding milk to coffee instead of creamer.   Teaching Method Utilized:  Visual Auditory Hands on  Barriers to learning/adherence to lifestyle change: none  Demonstrated degree of understanding via:  Teach Back    Monitoring/Evaluation:  Dietary intake, exercise, and body weight. Follow up in 1 months for 6 month supervised weight loss visit.

## 2015-11-08 NOTE — Patient Instructions (Signed)
Try to go skating on Sunday nights Continue to work on limiting carbs. Continue to work on chewing well. Continue adding milk to coffee instead of creamer.    

## 2015-11-18 ENCOUNTER — Other Ambulatory Visit: Payer: Self-pay | Admitting: Physician Assistant

## 2015-11-26 ENCOUNTER — Ambulatory Visit (INDEPENDENT_AMBULATORY_CARE_PROVIDER_SITE_OTHER): Payer: BLUE CROSS/BLUE SHIELD | Admitting: Endocrinology

## 2015-11-26 ENCOUNTER — Encounter: Payer: Self-pay | Admitting: Endocrinology

## 2015-11-26 VITALS — BP 132/88 | HR 83 | Ht 66.5 in | Wt 270.0 lb

## 2015-11-26 DIAGNOSIS — Z794 Long term (current) use of insulin: Secondary | ICD-10-CM | POA: Diagnosis not present

## 2015-11-26 DIAGNOSIS — E1140 Type 2 diabetes mellitus with diabetic neuropathy, unspecified: Secondary | ICD-10-CM

## 2015-11-26 LAB — POCT GLYCOSYLATED HEMOGLOBIN (HGB A1C): Hemoglobin A1C: 8

## 2015-11-26 MED ORDER — INSULIN GLARGINE 100 UNIT/ML SOLOSTAR PEN: 30.0000 [IU] | PEN_INJECTOR | Freq: Every day | SUBCUTANEOUS | 11 refills | Status: DC

## 2015-11-26 MED ORDER — INSULIN ASPART 100 UNIT/ML FLEXPEN: 20.0000 [IU] | PEN_INJECTOR | Freq: Every day | SUBCUTANEOUS | 2 refills | Status: DC

## 2015-11-26 MED ORDER — INSULIN GLARGINE 100 UNIT/ML SOLOSTAR PEN: 50.0000 [IU] | PEN_INJECTOR | SUBCUTANEOUS | 11 refills | Status: DC

## 2015-11-26 NOTE — Progress Notes (Signed)
Subjective:    Patient ID: Shelby Scott, female    DOB: 1965-09-17, 50 y.o.   MRN: [id number]  HPI Pt returns for f/u of diabetes mellitus: DM type: Insulin-requiring type 2 Dx'ed: 2005 Complications: polyneuropathy Therapy: insulin since 2013 GDM: never DKA: never Severe hypoglycemia: never. Pancreatitis: never Other: she takes multiple daily injections Interval history: she is taking the full 1.8 mg of victoza per day.  the no cbg record, but states cbg's vary from 78-300's.  It is in general lowest at lunch, and highest at HS.  She is still pursuing the weight loss surgery.  She takes lantus, 30 units BID, and novolog 10 units qd.   Past Medical History:  Diagnosis Date  . Anemia   . Anxiety   . Asthma   . Depression   . Diabetes mellitus without complication [facility])     Past Surgical History:  Procedure Laterality Date  . CHOLECYSTECTOMY      Social History   Social History  . Marital status: Divorced    Spouse name: N/A  . Number of children: N/A  . Years of education: N/A   Occupational History  . Not on file.   Social History Main Topics  . Smoking status: Never Smoker  . Smokeless tobacco: Not on file  . Alcohol use No  . Drug use: No  . Sexual activity: No   Other Topics Concern  . Not on file   Social History Narrative  . No narrative on file    Current Outpatient Prescriptions on File Prior to Visit  Medication Sig Dispense Refill  . ALPHA-LIPOIC ACID PO Take by mouth 2 (two) times daily.     . Biotin 1000 MCG tablet Take 1,000 mcg by mouth daily.     . clotrimazole (MYCELEX) 10 MG troche Take 1 tablet (10 mg total) by mouth 5 (five) times daily. 25 tablet o  . cyclobenzaprine (FLEXERIL) 5 MG tablet Take 1-2 tablets (5-10 mg total) by mouth 3 (three) times daily as needed for muscle spasms. 30 tablet 1  . gabapentin (NEURONTIN) 300 MG capsule TAKE 1 CAPSULE(300 MG) BY MOUTH THREE TIMES DAILY 90 capsule 0  . glucose blood (ONE TOUCH ULTRA TEST)  test strip 1 each by Other route 2 (two) times daily. And lancets 2/day 100 each 12  . Liraglutide (VICTOZA) 18 MG/3ML SOPN Inject 0.3 mLs (1.8 mg total) into the skin daily. As discussed 18 mL 3  . meloxicam (MOBIC) 7.5 MG tablet Take 1-2 tablets (7.5-15 mg total) by mouth daily. 30 tablet 0  . metFORMIN (GLUCOPHAGE-XR) 500 MG 24 hr tablet Take 4 tablets (2,000 mg total) by mouth daily with breakfast. 360 tablet 3  . methocarbamol (ROBAXIN) 500 MG tablet Take 1 tablet (500 mg total) by mouth 2 (two) times daily. 20 tablet 0  . Multiple Vitamins-Minerals (MULTIVITAMIN WITH MINERALS) tablet Take 1 tablet by mouth daily.    . naproxen (NAPROSYN) 500 MG tablet Take 1 tablet (500 mg total) by mouth 2 (two) times daily. 30 tablet 0  . neomycin-polymyxin-hydrocortisone (CORTISPORIN) 3.5-10000-1 otic suspension Place 3 drops into both ears 4 (four) times daily. 10 mL 0  . polyethylene glycol powder (GLYCOLAX/MIRALAX) powder Mix 17 gms in 8 oz of H2O bid for 5-7 days (Patient taking differently: PRN) 289 g 0  . PROAIR HFA 108 (90 Base) MCG/ACT inhaler INHALE 2 PUFFS INTO THE LUNGS EVERY 6 HOURS AS NEEDED FOR WHEEZING OR SHORTNESS OF BREATH 8.5 g 0  . triamcinolone (  NASACORT AQ) 55 MCG/ACT AERO nasal inhaler Place 2 sprays into the nose daily. (Patient taking differently: Place 2 sprays into the nose as needed. ) 1 Inhaler 12  . venlafaxine XR (EFFEXOR XR) 150 MG 24 hr capsule Take 1 capsule (150 mg total) by mouth daily with breakfast. 90 capsule 3   No current facility-administered medications on file prior to visit.     No Known Allergies  Family History  Problem Relation Age of Onset  . Hypertension Father   . Diabetes Maternal Grandmother   . Mental illness Maternal Grandfather   . Diabetes Paternal Grandmother   . Hypertension Paternal Grandmother   . Heart disease Paternal Grandfather   . Diabetes Brother   . Mental illness Brother     BP 132/88   Pulse 83   Ht 5' 6.5" (1.689 m)   Wt  270 lb (122.5 kg)   SpO2 97%   BMI 42.93 kg/m   Review of Systems Nausea is resolved.      Objective:   Physical Exam VITAL SIGNS:  See vs page GENERAL: no distress Pulses: dorsalis pedis intact bilat.   MSK: no deformity of the feet, except for bilat bunions. CV: trace bilat leg edema Skin:  no ulcer on the feet.  normal color and temp on the feet. Neuro: sensation is intact to touch on the feet, but decreased from normal Ext: the right 2nd toenail is absent  A1c=8.1%    Assessment & Plan:  Insulin-requiring type 2 DM: worse. Noncompliance with cbg recording and insulin.  In this context, she needs a simpler insulin regimen.   Patient is advised the following: Patient Instructions  Please continue the same metformin and victoza, and:  take the novolog, just 20 units with supper, and:  Decrease the lantus to 50 units each morning.   check your blood sugar twice a day.  vary the time of day when you check, between before the 3 meals, and at bedtime.  also check if you have symptoms of your blood sugar being too high or too low.  please keep a record of the readings and bring it to your next appointment here (or you can bring the meter itself).  You can write it on any piece of paper.  please call us sooner if your blood sugar goes below 70, or if you have a lot of readings over 200. Please come back for a follow-up appointment in 2 months.

## 2015-11-26 NOTE — Patient Instructions (Addendum)
Please continue the same metformin and victoza, and:  take the novolog, just 20 units with supper, and:  Decrease the lantus to 50 units each morning.   check your blood sugar twice a day.  vary the time of day when you check, between before the 3 meals, and at bedtime.  also check if you have symptoms of your blood sugar being too high or too low.  please keep a record of the readings and bring it to your next appointment here (or you can bring the meter itself).  You can write it on any piece of paper.  please call us sooner if your blood sugar goes below 70, or if you have a lot of readings over 200. Please come back for a follow-up appointment in 2 months.

## 2015-11-29 ENCOUNTER — Other Ambulatory Visit: Payer: Self-pay

## 2015-11-29 MED ORDER — VENLAFAXINE HCL ER 150 MG PO CP24: 150.0000 mg | ORAL_CAPSULE | Freq: Every day | ORAL | 0 refills | Status: DC

## 2015-12-04 ENCOUNTER — Encounter: Payer: BLUE CROSS/BLUE SHIELD | Attending: General Surgery | Admitting: Dietician

## 2015-12-04 DIAGNOSIS — Z01818 Encounter for other preprocedural examination: Secondary | ICD-10-CM | POA: Diagnosis not present

## 2015-12-04 NOTE — Progress Notes (Signed)
  6 Months Supervised Weight Loss Visit:   Pre-Operative RYGB Surgery  Medical Nutrition Therapy:  Appt start time: [id number] end time:  0815  Primary concerns today: Supervised Weight Loss Visit. Returns with a 2 lb weight gain but feels like things are going well. Has been walking. Will be getting a treadmill. Has not gone skating yet. Drinking some protein shakes. Watching some carbs. Tried Fairlife milk instead and likes it.  Has had some high blood sugar reasons and not sure why. Having neuropathy in her feet. Has some stress at work. Hunger level is lower than it used to better.   Still having some water retention. Will be talking with new doctor tomorrow about that.   Weight: 268.2 lbs BMI: 44.0  Preferred Learning Style:   No preference indicated   Learning Readiness:   Ready  24-hr recall: B (AM): bacon/meat or Atkins protein shake Snk (AM): none L (PM): salad with cheese and bacon bits with yogurt or peanut butter sandwich sometimes with banana Snk (PM): none D (PM): chicken sometimes with vegetables and starch Snk (PM): apple  Beverages: 32 oz coffee with sugar free creamer, crystal light, unsweet tea, lemon water   Medications: see list   Recent physical activity:  Walking inside at lunch time   Progress Towards Goal(s):  In progress.  Handouts given during visit include:  none   Nutritional Diagnosis:  [place]-3.3 Obesity related to past poor dietary habits and physical inactivity as evidenced by patient attending supervised weight loss for insurance approval of bariatric surgery.    Intervention:  Nutrition counseling provided. Plan: Call [facility] to check in to see if you need more appointments. Continue exercising and increase when you can. Start taking an over the counter (3000 IU Vitamin D).   Teaching Method Utilized:  Visual Auditory Hands on  Barriers to learning/adherence to lifestyle change: none  Demonstrated degree of understanding via:  Teach Back    Monitoring/Evaluation:  Dietary intake, exercise, and body weight. Follow up in 1 months for 6 month supervised weight loss visit.

## 2015-12-04 NOTE — Patient Instructions (Addendum)
Call [facility] to check in to see if you need more appointments. Continue exercising and increase when you can. Start taking an over the counter (3000 IU Vitamin D).

## 2015-12-05 DIAGNOSIS — S4992XA Unspecified injury of left shoulder and upper arm, initial encounter: Secondary | ICD-10-CM | POA: Diagnosis not present

## 2015-12-05 DIAGNOSIS — M25512 Pain in left shoulder: Secondary | ICD-10-CM | POA: Diagnosis not present

## 2015-12-05 DIAGNOSIS — R609 Edema, unspecified: Secondary | ICD-10-CM | POA: Diagnosis not present

## 2015-12-12 DIAGNOSIS — H35363 Drusen (degenerative) of macula, bilateral: Secondary | ICD-10-CM | POA: Diagnosis not present

## 2015-12-12 DIAGNOSIS — E113291 Type 2 diabetes mellitus with mild nonproliferative diabetic retinopathy without macular edema, right eye: Secondary | ICD-10-CM | POA: Diagnosis not present

## 2015-12-17 DIAGNOSIS — E119 Type 2 diabetes mellitus without complications: Secondary | ICD-10-CM | POA: Diagnosis not present

## 2015-12-19 DIAGNOSIS — Z23 Encounter for immunization: Secondary | ICD-10-CM | POA: Diagnosis not present

## 2015-12-23 ENCOUNTER — Other Ambulatory Visit: Payer: Self-pay | Admitting: Physician Assistant

## 2015-12-24 NOTE — Telephone Encounter (Signed)
Called pt to advise she is overdue for depression f/up for refills. Transferred pt to clerical to schdule appt and will send in Rf to last until then. appt sch for Thurs and pt has enough med until then.

## 2015-12-25 ENCOUNTER — Ambulatory Visit: Payer: BLUE CROSS/BLUE SHIELD

## 2015-12-26 ENCOUNTER — Encounter: Payer: Self-pay | Admitting: Physician Assistant

## 2015-12-26 ENCOUNTER — Ambulatory Visit (INDEPENDENT_AMBULATORY_CARE_PROVIDER_SITE_OTHER): Payer: BLUE CROSS/BLUE SHIELD | Admitting: Physician Assistant

## 2015-12-26 VITALS — BP 138/80 | HR 101 | Temp 98.9°F | Resp 16 | Ht 65.5 in | Wt 270.0 lb

## 2015-12-26 DIAGNOSIS — F418 Other specified anxiety disorders: Secondary | ICD-10-CM | POA: Diagnosis not present

## 2015-12-26 DIAGNOSIS — S91109A Unspecified open wound of unspecified toe(s) without damage to nail, initial encounter: Secondary | ICD-10-CM

## 2015-12-26 DIAGNOSIS — F419 Anxiety disorder, unspecified: Principal | ICD-10-CM

## 2015-12-26 DIAGNOSIS — F32A Depression, unspecified: Secondary | ICD-10-CM

## 2015-12-26 DIAGNOSIS — F329 Major depressive disorder, single episode, unspecified: Secondary | ICD-10-CM

## 2015-12-26 MED ORDER — GABAPENTIN 300 MG PO CAPS: ORAL_CAPSULE | ORAL | 5 refills | Status: DC

## 2015-12-26 MED ORDER — VENLAFAXINE HCL ER 150 MG PO CP24: 150.0000 mg | ORAL_CAPSULE | Freq: Every day | ORAL | 5 refills | Status: DC

## 2015-12-26 MED ORDER — MUPIROCIN 2 % EX OINT: 1.0000 "application " | TOPICAL_OINTMENT | Freq: Three times a day (TID) | CUTANEOUS | 1 refills | Status: DC

## 2015-12-26 NOTE — Patient Instructions (Addendum)
Medications will cover for 6 months. Please apply the mupirocin ointment to the toe. Wash the toes with soap and water twice a day. You can use antibacterial soap. He can use a hypoallergenic soap. Stay clear from deodorant soaps at this time. Do not apply hydrogen peroxide or alcohol to this area. Only do this open water. Allow the 2 toes to air out by not placing any mupirocin ointment at night.    IF you received an x-ray today, you will [facility]. Please contact [facility] at [phone] with questions or concerns regarding your invoice.   IF you received labwork today, you will receive an invoice from [facility]. Please contact [facility] at [phone] with questions or concerns regarding your invoice.   Our billing staff will not be able to assist you with questions regarding bills from these companies.  You will be contacted with the lab results as soon as they are available. The fastest way to get your results is to activate your My Chart account. Instructions are located on the last page of this paperwork. If you have not heard from us regarding the results in 2 weeks, please contact this office.

## 2015-12-26 NOTE — Progress Notes (Signed)
[facility] [address] [place] [address] [id number] [phone]  Date:  12/26/2015   Name:  Shelby Scott   DOB:  1965/05/04   MRN:  [id number]  PCP:  Trena Platt, PA   Chief Complaint  Patient presents with  . Medication Refill    gabapentin, effexor    History of Present Illness:  Shelby Scott is a 50 y.o. female patient who presents to [facility] for medication of gabapentin and effexor.    --The gabapentin is working well for her neurological pain secondary to her diabetes. She does have some throbbing in her great toe from time to time. This is not occurring currently. She denies any kind of swelling of this toe or warmth to the area. --The Effexor is working well for anxiety and depression. She denies any panic attacks. She is taking the medication compliantly. She still has remaining medicine. --She does voice 1 complaint of wound on her third right toe. She hit her toenail against an object and it fell off. The same occurred at her second toe. It has been mildly red and draining a clear fluid. She denies any fever. There is no pain. She states that is having difficult time healing. It has been one week. There is lack of sensation of this area however she does have sensation upon my palpation.  She has been cleaning the toe with Hytrin peroxide and placing triple a ointment on the area. It has not progressively worsened, but staying the same.  Patient notes that her diabetes has an slightly uncontrolled. Her blood sugar was 250 this morning. She was compliant in using the Lantus. She reports that her endocrinologist has discontinued the NovoLog at this time. She is currently taking the metformin and Lantus only.    Patient Active Problem List   Diagnosis Date Noted  . Type 2 diabetes mellitus with diabetic neuropathy (HCC) 10/08/2014    Past Medical History:  Diagnosis Date  . Anemia   . Anxiety   . Asthma   . Depression   . Diabetes mellitus without  complication [facility])     Past Surgical History:  Procedure Laterality Date  . CHOLECYSTECTOMY      Social History  Substance Use Topics  . Smoking status: Never Smoker  . Smokeless tobacco: Not on file  . Alcohol use No    Family History  Problem Relation Age of Onset  . Hypertension Father   . Diabetes Maternal Grandmother   . Mental illness Maternal Grandfather   . Diabetes Paternal Grandmother   . Hypertension Paternal Grandmother   . Heart disease Paternal Grandfather   . Diabetes Brother   . Mental illness Brother     No Known Allergies  Medication list has been reviewed and updated.  Current Outpatient Prescriptions on File Prior to Visit  Medication Sig Dispense Refill  . ALPHA-LIPOIC ACID PO Take by mouth 2 (two) times daily.     . Biotin 1000 MCG tablet Take 1,000 mcg by mouth daily.     Marland Kitchen gabapentin (NEURONTIN) 300 MG capsule TAKE 1 CAPSULE(300 MG) BY MOUTH THREE TIMES DAILY 90 capsule 0  . glucose blood (ONE TOUCH ULTRA TEST) test strip 1 each by Other route 2 (two) times daily. And lancets 2/day 100 each 12  . insulin aspart (NOVOLOG FLEXPEN) 100 UNIT/ML FlexPen Inject 20 Units into the skin daily with supper. 15 mL 2  . Insulin Glargine (LANTUS SOLOSTAR) 100 UNIT/ML Solostar Pen Inject 50 Units into  the skin every morning. 10 pen 11  . metFORMIN (GLUCOPHAGE-XR) 500 MG 24 hr tablet Take 4 tablets (2,000 mg total) by mouth daily with breakfast. 360 tablet 3  . Multiple Vitamins-Minerals (MULTIVITAMIN WITH MINERALS) tablet Take 1 tablet by mouth daily.    . naproxen (NAPROSYN) 500 MG tablet Take 1 tablet (500 mg total) by mouth 2 (two) times daily. 30 tablet 0  . neomycin-polymyxin-hydrocortisone (CORTISPORIN) 3.5-10000-1 otic suspension Place 3 drops into both ears 4 (four) times daily. 10 mL 0  . polyethylene glycol powder (GLYCOLAX/MIRALAX) powder Mix 17 gms in 8 oz of H2O bid for 5-7 days (Patient taking differently: PRN) 289 g 0  . PROAIR HFA 108 (90 Base)  MCG/ACT inhaler INHALE 2 PUFFS INTO THE LUNGS EVERY 6 HOURS AS NEEDED FOR WHEEZING OR SHORTNESS OF BREATH 8.5 g 0  . venlafaxine XR (EFFEXOR XR) 150 MG 24 hr capsule Take 1 capsule (150 mg total) by mouth daily with breakfast. 30 capsule 0  . clotrimazole (MYCELEX) 10 MG troche Take 1 tablet (10 mg total) by mouth 5 (five) times daily. (Patient not taking: Reported on 12/26/2015) 25 tablet o  . cyclobenzaprine (FLEXERIL) 5 MG tablet Take 1-2 tablets (5-10 mg total) by mouth 3 (three) times daily as needed for muscle spasms. (Patient not taking: Reported on 12/26/2015) 30 tablet 1  . Liraglutide (VICTOZA) 18 MG/3ML SOPN Inject 0.3 mLs (1.8 mg total) into the skin daily. As discussed (Patient not taking: Reported on 12/26/2015) 18 mL 3  . meloxicam (MOBIC) 7.5 MG tablet Take 1-2 tablets (7.5-15 mg total) by mouth daily. (Patient not taking: Reported on 12/26/2015) 30 tablet 0  . methocarbamol (ROBAXIN) 500 MG tablet Take 1 tablet (500 mg total) by mouth 2 (two) times daily. (Patient not taking: Reported on 12/26/2015) 20 tablet 0  . triamcinolone (NASACORT AQ) 55 MCG/ACT AERO nasal inhaler Place 2 sprays into the nose daily. (Patient not taking: Reported on 12/26/2015) 1 Inhaler 12   No current facility-administered medications on file prior to visit.     ROS ROS otherwise unremarkable unless listed above.   Physical Examination: BP 138/80   Pulse (!) 101   Temp 98.9 F (37.2 C) (Oral)   Resp 16   Ht 5' 5.5" (1.664 m)   Wt 270 lb (122.5 kg)   LMP 12/12/2015   SpO2 98%   BMI 44.25 kg/m  Ideal Body Weight: Weight in (lb) to have BMI = 25: 152.2  Physical Exam  Constitutional: She is oriented to person, place, and time. She appears well-developed and well-nourished. No distress.  HENT:  Head: Normocephalic and atraumatic.  Right Ear: External ear normal.  Left Ear: External ear normal.  Eyes: Conjunctivae and EOM are normal. Pupils are equal, round, and reactive to light.  Cardiovascular:  Normal rate.  Exam reveals no friction rub.   No murmur heard. Pulmonary/Chest: Effort normal. No respiratory distress.  Neurological: She is alert and oriented to person, place, and time.  Skin: She is not diaphoretic.  Right second and third toes with nail that has been removed. There is abrasions across the nail bed. There is a clear fluid that appears consistent with the ointment that she is using. There is no tenderness upon palpation minimal erythema. Normal range of motion.  Psychiatric: She has a normal mood and affect. Her behavior is normal.     Assessment and Plan: Dyke BrackettShann Freeman is a 50 y.o. female who is here today for medication refill and complaint of broken  toenails. I've advised that she may need to use some shoes that are more covering and with correction insulation however she states that she cannot stand to have her foot completely covered and wears slipper-like shoes.  Given mupirocin ointment today. I've advised that she let it air dry during the nighttime. Also advised that she stop using height should trend peroxide at this time and only use soap and water to cleanse the toes. I will contact Dr. Everardo AllEllison to see if he would like to continue the NovoLog at this time. Anxiety and depression - Plan: gabapentin (NEURONTIN) 300 MG capsule, venlafaxine XR (EFFEXOR XR) 150 MG 24 hr capsule  Open toe wound, initial encounter - Plan: mupirocin ointment (BACTROBAN) 2 %  Trena PlattStephanie English, PA-C [facility] 11/2/201712:16 PM

## 2015-12-27 ENCOUNTER — Telehealth: Payer: Self-pay | Admitting: Endocrinology

## 2015-12-27 NOTE — Telephone Encounter (Signed)
please call patient: How is the blood sugar doing?  Is it higher at hs, or in am?

## 2015-12-27 NOTE — Telephone Encounter (Signed)
I contacted the patient and advised of message via voicemail. Requested a call back to verify how the blood sugar readings are doing.

## 2016-01-12 ENCOUNTER — Other Ambulatory Visit: Payer: Self-pay | Admitting: Emergency Medicine

## 2016-01-15 DIAGNOSIS — F509 Eating disorder, unspecified: Secondary | ICD-10-CM | POA: Diagnosis not present

## 2016-01-26 NOTE — Progress Notes (Signed)
Subjective:    Patient ID: Shelby Scott, female    DOB: Jul 04, 1965, 50 y.o.   MRN: [id number]  HPI Pt returns for f/u of diabetes mellitus: DM type: Insulin-requiring type 2 Dx'ed: 2005 Complications: polyneuropathy Therapy: insulin since 2013, metformin, and victoza GDM: never DKA: never Severe hypoglycemia: never. Pancreatitis: never.  Other: she takes multiple daily injections.  Interval history: no cbg record, but states cbg's are in the mid-100's.  There is no trend throughout the day.  She takes novolog 20 units qd.  She takes lantus 50 units qam and 40 units qpm.  pt states she feels well in general.  Past Medical History:  Diagnosis Date  . Anemia   . Anxiety   . Asthma   . Depression   . Diabetes mellitus without complication [facility](HCC)     Past Surgical History:  Procedure Laterality Date  . CHOLECYSTECTOMY      Social History   Social History  . Marital status: Divorced    Spouse name: N/A  . Number of children: N/A  . Years of education: N/A   Occupational History  . Not on file.   Social History Main Topics  . Smoking status: Never Smoker  . Smokeless tobacco: Not on file  . Alcohol use No  . Drug use: No  . Sexual activity: No   Other Topics Concern  . Not on file   Social History Narrative  . No narrative on file    Current Outpatient Prescriptions on File Prior to Visit  Medication Sig Dispense Refill  . ALPHA-LIPOIC ACID PO Take by mouth 2 (two) times daily.     . Biotin 1000 MCG tablet Take 1,000 mcg by mouth daily.     . clotrimazole (MYCELEX) 10 MG troche Take 1 tablet (10 mg total) by mouth 5 (five) times daily. 25 tablet o  . gabapentin (NEURONTIN) 300 MG capsule TAKE 1 CAPSULE(300 MG) BY MOUTH THREE TIMES DAILY 90 capsule 5  . glucose blood (ONE TOUCH ULTRA TEST) test strip 1 each by Other route 2 (two) times daily. And lancets 2/day 100 each 12  . insulin aspart (NOVOLOG FLEXPEN) 100 UNIT/ML FlexPen Inject 20 Units into the skin daily  with supper. 15 mL 2  . Liraglutide (VICTOZA) 18 MG/3ML SOPN Inject 0.3 mLs (1.8 mg total) into the skin daily. As discussed 18 mL 3  . meloxicam (MOBIC) 7.5 MG tablet Take 1-2 tablets (7.5-15 mg total) by mouth daily. 30 tablet 0  . metFORMIN (GLUCOPHAGE-XR) 500 MG 24 hr tablet Take 4 tablets (2,000 mg total) by mouth daily with breakfast. 360 tablet 3  . methocarbamol (ROBAXIN) 500 MG tablet Take 1 tablet (500 mg total) by mouth 2 (two) times daily. 20 tablet 0  . Multiple Vitamins-Minerals (MULTIVITAMIN WITH MINERALS) tablet Take 1 tablet by mouth daily.    . mupirocin ointment (BACTROBAN) 2 % Apply 1 application topically 3 (three) times daily. 22 g 1  . naproxen (NAPROSYN) 500 MG tablet Take 1 tablet (500 mg total) by mouth 2 (two) times daily. 30 tablet 0  . neomycin-polymyxin-hydrocortisone (CORTISPORIN) 3.5-10000-1 otic suspension Place 3 drops into both ears 4 (four) times daily. 10 mL 0  . polyethylene glycol powder (GLYCOLAX/MIRALAX) powder Mix 17 gms in 8 oz of H2O bid for 5-7 days (Patient taking differently: PRN) 289 g 0  . PROAIR HFA 108 (90 Base) MCG/ACT inhaler INHALE 2 PUFFS INTO THE LUNGS EVERY 6 HOURS AS NEEDED FOR WHEEZING OR SHORTNESS OF BREATH 8.5  g 0  . venlafaxine XR (EFFEXOR XR) 150 MG 24 hr capsule Take 1 capsule (150 mg total) by mouth daily with breakfast. 30 capsule 5   No current facility-administered medications on file prior to visit.     No Known Allergies  Family History  Problem Relation Age of Onset  . Hypertension Father   . Diabetes Maternal Grandmother   . Mental illness Maternal Grandfather   . Diabetes Paternal Grandmother   . Hypertension Paternal Grandmother   . Heart disease Paternal Grandfather   . Diabetes Brother   . Mental illness Brother    BP (!) 149/93   Pulse 78   Review of Systems She denies hypoglycemia.     Objective:   Physical Exam VITAL SIGNS:  See vs page GENERAL: no distress Pulses: dorsalis pedis intact bilat.     MSK: no deformity of the feet, except for bilat bunions. CV: trace bilat leg edema Skin:  no ulcer on the feet.  normal color and temp on the feet. Neuro: sensation is intact to touch on the feet, but decreased from normal.    Lab Results  Component Value Date   HGBA1C 8.0 01/27/2016       Assessment & Plan:  Insulin-requiring type 2 DM: she needs increased rx.   Patient is advised the following: Patient Instructions  Please continue the same metformin and victoza, and:  take the novolog, just 20 units with supper, and:  change the lantus to 100 units each morning, and none in the evening.   check your blood sugar twice a day.  vary the time of day when you check, between before the 3 meals, and at bedtime.  also check if you have symptoms of your blood sugar being too high or too low.  please keep a record of the readings and bring it to your next appointment here (or you can bring the meter itself).  You can write it on any piece of paper.  please call us sooner if your blood sugar goes below 70, or if you have a lot of readings over 200. Please come back for a follow-up appointment in 2 months.

## 2016-01-27 ENCOUNTER — Ambulatory Visit (INDEPENDENT_AMBULATORY_CARE_PROVIDER_SITE_OTHER): Payer: BLUE CROSS/BLUE SHIELD | Admitting: Endocrinology

## 2016-01-27 ENCOUNTER — Encounter: Payer: Self-pay | Admitting: Endocrinology

## 2016-01-27 VITALS — BP 149/93 | HR 78

## 2016-01-27 DIAGNOSIS — E1140 Type 2 diabetes mellitus with diabetic neuropathy, unspecified: Secondary | ICD-10-CM | POA: Diagnosis not present

## 2016-01-27 DIAGNOSIS — Z794 Long term (current) use of insulin: Secondary | ICD-10-CM | POA: Diagnosis not present

## 2016-01-27 LAB — POCT GLYCOSYLATED HEMOGLOBIN (HGB A1C): Hemoglobin A1C: 8

## 2016-01-27 MED ORDER — INSULIN GLARGINE 100 UNIT/ML SOLOSTAR PEN: 100.0000 [IU] | PEN_INJECTOR | SUBCUTANEOUS | 11 refills | Status: DC

## 2016-01-27 NOTE — Patient Instructions (Addendum)
Please continue the same metformin and victoza, and:  take the novolog, just 20 units with supper, and:  change the lantus to 100 units each morning, and none in the evening.   check your blood sugar twice a day.  vary the time of day when you check, between before the 3 meals, and at bedtime.  also check if you have symptoms of your blood sugar being too high or too low.  please keep a record of the readings and bring it to your next appointment here (or you can bring the meter itself).  You can write it on any piece of paper.  please call us sooner if your blood sugar goes below 70, or if you have a lot of readings over 200. Please come back for a follow-up appointment in 2 months.

## 2016-02-06 ENCOUNTER — Telehealth: Payer: Self-pay | Admitting: Endocrinology

## 2016-03-17 DIAGNOSIS — E1351 Other specified diabetes mellitus with diabetic peripheral angiopathy without gangrene: Secondary | ICD-10-CM | POA: Diagnosis not present

## 2016-03-17 DIAGNOSIS — M21612 Bunion of left foot: Secondary | ICD-10-CM | POA: Diagnosis not present

## 2016-03-17 DIAGNOSIS — M21611 Bunion of right foot: Secondary | ICD-10-CM | POA: Diagnosis not present

## 2016-03-17 DIAGNOSIS — G609 Hereditary and idiopathic neuropathy, unspecified: Secondary | ICD-10-CM | POA: Diagnosis not present

## 2016-04-01 ENCOUNTER — Encounter: Payer: Self-pay | Admitting: Endocrinology

## 2016-04-01 ENCOUNTER — Ambulatory Visit (INDEPENDENT_AMBULATORY_CARE_PROVIDER_SITE_OTHER): Payer: BLUE CROSS/BLUE SHIELD | Admitting: Endocrinology

## 2016-04-01 VITALS — BP 142/78 | HR 99 | Ht 65.5 in | Wt 274.0 lb

## 2016-04-01 DIAGNOSIS — Z794 Long term (current) use of insulin: Secondary | ICD-10-CM | POA: Diagnosis not present

## 2016-04-01 DIAGNOSIS — E1140 Type 2 diabetes mellitus with diabetic neuropathy, unspecified: Secondary | ICD-10-CM | POA: Diagnosis not present

## 2016-04-01 LAB — POCT GLYCOSYLATED HEMOGLOBIN (HGB A1C): Hemoglobin A1C: 7.6

## 2016-04-01 MED ORDER — INSULIN ASPART 100 UNIT/ML FLEXPEN: 30.0000 [IU] | PEN_INJECTOR | Freq: Every day | SUBCUTANEOUS | 2 refills | Status: DC

## 2016-04-01 MED ORDER — INSULIN GLARGINE 100 UNIT/ML SOLOSTAR PEN: 90.0000 [IU] | PEN_INJECTOR | SUBCUTANEOUS | 11 refills | Status: DC

## 2016-04-01 NOTE — Patient Instructions (Addendum)
Please continue the same metformin and victoza.   Please change the insulins the the numbers listed below.  check your blood sugar twice a day.  vary the time of day when you check, between before the 3 meals, and at bedtime.  also check if you have symptoms of your blood sugar being too high or too low.  please keep a record of the readings and bring it to your next appointment here (or you can bring the meter itself).  You can write it on any piece of paper.  please call us sooner if your blood sugar goes below 70, or if you have a lot of readings over 200. Please come back for a follow-up appointment in 3 months.

## 2016-04-01 NOTE — Progress Notes (Signed)
Subjective:    Patient ID: Shelby BrackettShann Scott, female    DOB: [birth year], 51 y.o.   MRN: [id number]  HPI Pt returns for f/u of diabetes mellitus: DM type: Insulin-requiring type 2 Dx'ed: 2005 Complications: polyneuropathy Therapy: insulin since 2013, metformin, and victoza GDM: never DKA: never Severe hypoglycemia: never. Pancreatitis: never.  Other: she takes 2 insulins both QD, after poor results with multiple daily injections.   Interval history: no cbg record, but states cbg's are in the mid-100's.  It is highest at HS.  pt states she feels well in general. Since last ov, she has had only 1 episode of hypoglycemia, and this was mild (70).  She happened at lunch, but she cannot recall the reason.  She is pursuing weight loss surgery.   Past Medical History:  Diagnosis Date  . Anemia   . Anxiety   . Asthma   . Depression   . Diabetes mellitus without complication [facility](HCC)     Past Surgical History:  Procedure Laterality Date  . CHOLECYSTECTOMY      Social History   Social History  . Marital status: Divorced    Spouse name: N/A  . Number of children: N/A  . Years of education: N/A   Occupational History  . Not on file.   Social History Main Topics  . Smoking status: Never Smoker  . Smokeless tobacco: Never Used  . Alcohol use No  . Drug use: No  . Sexual activity: No   Other Topics Concern  . Not on file   Social History Narrative  . No narrative on file    Current Outpatient Prescriptions on File Prior to Visit  Medication Sig Dispense Refill  . ALPHA-LIPOIC ACID PO Take by mouth 2 (two) times daily.     . Biotin 1000 MCG tablet Take 1,000 mcg by mouth daily.     . clotrimazole (MYCELEX) 10 MG troche Take 1 tablet (10 mg total) by mouth 5 (five) times daily. 25 tablet o  . gabapentin (NEURONTIN) 300 MG capsule TAKE 1 CAPSULE(300 MG) BY MOUTH THREE TIMES DAILY 90 capsule 5  . glucose blood (ONE TOUCH ULTRA TEST) test strip 1 each by Other route 2 (two) times  daily. And lancets 2/day 100 each 12  . Liraglutide (VICTOZA) 18 MG/3ML SOPN Inject 0.3 mLs (1.8 mg total) into the skin daily. As discussed 18 mL 3  . meloxicam (MOBIC) 7.5 MG tablet Take 1-2 tablets (7.5-15 mg total) by mouth daily. 30 tablet 0  . metFORMIN (GLUCOPHAGE-XR) 500 MG 24 hr tablet Take 4 tablets (2,000 mg total) by mouth daily with breakfast. 360 tablet 3  . methocarbamol (ROBAXIN) 500 MG tablet Take 1 tablet (500 mg total) by mouth 2 (two) times daily. 20 tablet 0  . Multiple Vitamins-Minerals (MULTIVITAMIN WITH MINERALS) tablet Take 1 tablet by mouth daily.    . mupirocin ointment (BACTROBAN) 2 % Apply 1 application topically 3 (three) times daily. 22 g 1  . naproxen (NAPROSYN) 500 MG tablet Take 1 tablet (500 mg total) by mouth 2 (two) times daily. 30 tablet 0  . neomycin-polymyxin-hydrocortisone (CORTISPORIN) 3.5-10000-1 otic suspension Place 3 drops into both ears 4 (four) times daily. 10 mL 0  . polyethylene glycol powder (GLYCOLAX/MIRALAX) powder Mix 17 gms in 8 oz of H2O bid for 5-7 days (Patient taking differently: PRN) 289 g 0  . PROAIR HFA 108 (90 Base) MCG/ACT inhaler INHALE 2 PUFFS INTO THE LUNGS EVERY 6 HOURS AS NEEDED FOR WHEEZING OR SHORTNESS OF  BREATH 8.5 g 0  . venlafaxine XR (EFFEXOR XR) 150 MG 24 hr capsule Take 1 capsule (150 mg total) by mouth daily with breakfast. 30 capsule 5   No current facility-administered medications on file prior to visit.     No Known Allergies  Family History  Problem Relation Age of Onset  . Hypertension Father   . Diabetes Maternal Grandmother   . Mental illness Maternal Grandfather   . Diabetes Paternal Grandmother   . Hypertension Paternal Grandmother   . Heart disease Paternal Grandfather   . Diabetes Brother   . Mental illness Brother     BP (!) 142/78   Pulse 99   Ht 5' 5.5" (1.664 m)   Wt 274 lb (124.3 kg)   SpO2 97%   BMI 44.90 kg/m    Review of Systems Denies LOC.      Objective:   Physical Exam VITAL  SIGNS:  See vs page GENERAL: no distress Pulses: dorsalis pedis intact bilat.   MSK: no deformity of the feet, except for bilat bunions. CV: trace bilat leg edema Skin:  no ulcer on the feet.  normal color and temp on the feet. Neuro: sensation is intact to touch on the feet, but decreased from normal.    A1c=7.6%     Assessment & Plan:  Insulin-requiring type 2 DM, with polyneuropathy: Based on the pattern of her cbg's, she needs some adjustment in her therapy.   Patient is advised the following: Patient Instructions  Please continue the same metformin and victoza.   Please change the insulins the the numbers listed below.  check your blood sugar twice a day.  vary the time of day when you check, between before the 3 meals, and at bedtime.  also check if you have symptoms of your blood sugar being too high or too low.  please keep a record of the readings and bring it to your next appointment here (or you can bring the meter itself).  You can write it on any piece of paper.  please call us sooner if your blood sugar goes below 70, or if you have a lot of readings over 200. Please come back for a follow-up appointment in 3 months.

## 2016-04-06 ENCOUNTER — Ambulatory Visit (INDEPENDENT_AMBULATORY_CARE_PROVIDER_SITE_OTHER): Payer: BLUE CROSS/BLUE SHIELD | Admitting: Emergency Medicine

## 2016-04-06 VITALS — BP 144/94 | HR 62 | Temp 98.1°F | Resp 17 | Ht 66.5 in | Wt 275.0 lb

## 2016-04-06 DIAGNOSIS — E1140 Type 2 diabetes mellitus with diabetic neuropathy, unspecified: Secondary | ICD-10-CM

## 2016-04-06 DIAGNOSIS — Z794 Long term (current) use of insulin: Secondary | ICD-10-CM

## 2016-04-06 DIAGNOSIS — F418 Other specified anxiety disorders: Secondary | ICD-10-CM | POA: Diagnosis not present

## 2016-04-06 DIAGNOSIS — I10 Essential (primary) hypertension: Secondary | ICD-10-CM

## 2016-04-06 DIAGNOSIS — F329 Major depressive disorder, single episode, unspecified: Secondary | ICD-10-CM

## 2016-04-06 DIAGNOSIS — F419 Anxiety disorder, unspecified: Secondary | ICD-10-CM

## 2016-04-06 DIAGNOSIS — F32A Depression, unspecified: Secondary | ICD-10-CM

## 2016-04-06 MED ORDER — LISINOPRIL 10 MG PO TABS: 10.0000 mg | ORAL_TABLET | Freq: Every day | ORAL | 3 refills | Status: DC

## 2016-04-06 NOTE — Patient Instructions (Addendum)
   IF you received an x-ray today, you will receive an invoice from [facility]. Please contact [facility] at [phone] with questions or concerns regarding your invoice.   IF you received labwork today, you will receive an invoice from [facility]. Please contact [facility] at [phone] with questions or concerns regarding your invoice.   Our billing staff will not be able to assist you with questions regarding bills from these companies.  You will be contacted with the lab results as soon as they are available. The fastest way to get your results is to activate your My Chart account. Instructions are located on the last page of this paperwork. If you have not heard from us regarding the results in 2 weeks, please contact this office.         Hypertension Hypertension is another name for high blood pressure. High blood pressure forces your heart to work harder to pump blood. A blood pressure reading has two numbers, which includes a higher number over a lower number (example: 110/72). Follow these instructions at home:  Have your blood pressure rechecked by your doctor.  Only take medicine as told by your doctor. Follow the directions carefully. The medicine does not work as well if you skip doses. Skipping doses also puts you at risk for problems.  Do not smoke.  Monitor your blood pressure at home as told by your doctor. Contact a doctor if:  You think you are having a reaction to the medicine you are taking.  You have repeat headaches or feel dizzy.  You have puffiness (swelling) in your ankles.  You have trouble with your vision. Get help right away if:  You get a very bad headache and are confused.  You feel weak, numb, or faint.  You get chest or belly (abdominal) pain.  You throw up (vomit).  You cannot breathe very well. This information is not intended to replace advice given to you by your health care provider. Make sure you discuss any  questions you have with your health care provider. Document Released: 07/29/2007 Document Revised: 07/18/2015 Document Reviewed: 12/02/2012 Elsevier Interactive Patient Education  2017 Elsevier Inc.   Low-Sodium Eating Plan Sodium raises blood pressure and causes water to be held in the body. Getting less sodium from food will help lower your blood pressure, reduce any swelling, and protect your heart, liver, and kidneys. We get sodium by adding salt (sodium chloride) to food. Most of our sodium comes from canned, boxed, and frozen foods. Restaurant foods, fast foods, and pizza are also very high in sodium. Even if you take medicine to lower your blood pressure or to reduce fluid in your body, getting less sodium from your food is important. What is my plan? Most people should limit their sodium intake to 2,300 mg a day. Your health care provider recommends that you limit your sodium intake to __________ a day. What do I need to know about this eating plan? For the low-sodium eating plan, you will follow these general guidelines:  Choose foods with a % Daily Value for sodium of less than 5% (as listed on the food label).  Use salt-free seasonings or herbs instead of table salt or sea salt.  Check with your health care provider or pharmacist before using salt substitutes.  Eat fresh foods.  Eat more vegetables and fruits.  Limit canned vegetables. If you do use them, rinse them well to decrease the sodium.  Limit cheese to 1 oz (28 g) per   day.  Eat lower-sodium products, often labeled as "lower sodium" or "no salt added."  Avoid foods that contain monosodium glutamate (MSG). MSG is sometimes added to [place] food and some canned foods.  Check food labels (Nutrition Facts labels) on foods to learn how much sodium is in one serving.  Eat more home-cooked food and less restaurant, buffet, and fast food.  When eating at a restaurant, ask that your food be prepared with less salt, or no  salt if possible. How do I read food labels for sodium information? The Nutrition Facts label lists the amount of sodium in one serving of the food. If you eat more than one serving, you must multiply the listed amount of sodium by the number of servings. Food labels may also identify foods as:  Sodium free-Less than 5 mg in a serving.  Very low sodium-35 mg or less in a serving.  Low sodium-140 mg or less in a serving.  Light in sodium-50% less sodium in a serving. For example, if a food that usually has 300 mg of sodium is changed to become light in sodium, it will have 150 mg of sodium.  Reduced sodium-25% less sodium in a serving. For example, if a food that usually has 400 mg of sodium is changed to reduced sodium, it will have 300 mg of sodium. What foods can I eat? Grains  Low-sodium cereals, including oats, puffed wheat and rice, and shredded wheat cereals. Low-sodium crackers. Unsalted rice and pasta. Lower-sodium bread. Vegetables  Frozen or fresh vegetables. Low-sodium or reduced-sodium canned vegetables. Low-sodium or reduced-sodium tomato sauce and paste. Low-sodium or reduced-sodium tomato and vegetable juices. Fruits  Fresh, frozen, and canned fruit. Fruit juice. Meat and Other Protein Products  Low-sodium canned tuna and salmon. Fresh or frozen meat, poultry, seafood, and fish. Lamb. Unsalted nuts. Dried beans, peas, and lentils without added salt. Unsalted canned beans. Homemade soups without salt. Eggs. Dairy  Milk. Soy milk. Ricotta cheese. Low-sodium or reduced-sodium cheeses. Yogurt. Condiments  Fresh and dried herbs and spices. Salt-free seasonings. Onion and garlic powders. Low-sodium varieties of mustard and ketchup. Fresh or refrigerated horseradish. Lemon juice. Fats and Oils  Reduced-sodium salad dressings. Unsalted butter. Other  Unsalted popcorn and pretzels. The items listed above may not be a complete list of recommended foods or beverages. Contact your  dietitian for more options.  What foods are not recommended? Grains  Instant hot cereals. Bread stuffing, pancake, and biscuit mixes. Croutons. Seasoned rice or pasta mixes. Noodle soup cups. Boxed or frozen macaroni and cheese. Self-rising flour. Regular salted crackers. Vegetables  Regular canned vegetables. Regular canned tomato sauce and paste. Regular tomato and vegetable juices. Frozen vegetables in sauces. Salted French fries. Olives. Pickles. Relishes. Sauerkraut. Salsa. Meat and Other Protein Products  Salted, canned, smoked, spiced, or pickled meats, seafood, or fish. Bacon, ham, sausage, hot dogs, corned beef, chipped beef, and packaged luncheon meats. Salt pork. Jerky. Pickled herring. Anchovies, regular canned tuna, and sardines. Salted nuts. Dairy  Processed cheese and cheese spreads. Cheese curds. Blue cheese and cottage cheese. Buttermilk. Condiments  Onion and garlic salt, seasoned salt, table salt, and sea salt. Canned and packaged gravies. Worcestershire sauce. Tartar sauce. Barbecue sauce. Teriyaki sauce. Soy sauce, including reduced sodium. Steak sauce. Fish sauce. Oyster sauce. Cocktail sauce. Horseradish that you find on the shelf. Regular ketchup and mustard. Meat flavorings and tenderizers. Bouillon cubes. Hot sauce. Tabasco sauce. Marinades. Taco seasonings. Relishes. Fats and Oils  Regular salad dressings. Salted butter. Margarine. Ghee.   Bacon fat. Other  Potato and tortilla chips. Corn chips and puffs. Salted popcorn and pretzels. Canned or dried soups. Pizza. Frozen entrees and pot pies. The items listed above may not be a complete list of foods and beverages to avoid. Contact your dietitian for more information.  This information is not intended to replace advice given to you by your health care provider. Make sure you discuss any questions you have with your health care provider. Document Released: 08/01/2001 Document Revised: 07/18/2015 Document Reviewed:  12/14/2012 Elsevier Interactive Patient Education  2017 Elsevier Inc.  

## 2016-04-06 NOTE — Progress Notes (Signed)
Shelby Scott 51 y.o.   Chief Complaint  Patient presents with  . Hypertension    HISTORY OF PRESENT ILLNESS: This is a 51 y.o. female complaining of elevated Bp. Has been in the range 140-160/90-110. States this am she had a headache, now gone. BP today was 160/110; diabetic.  HPI   Prior to Admission medications   Medication Sig Start Date End Date Taking? Authorizing Provider  ALPHA-LIPOIC ACID PO Take by mouth 2 (two) times daily.    Yes Historical Provider, MD  Biotin 1000 MCG tablet Take 1,000 mcg by mouth daily.    Yes Historical Provider, MD  gabapentin (NEURONTIN) 300 MG capsule TAKE 1 CAPSULE(300 MG) BY MOUTH THREE TIMES DAILY 12/26/15  Yes Stephanie D English, PA  glucose blood (ONE TOUCH ULTRA TEST) test strip 1 each by Other route 2 (two) times daily. And lancets 2/day 07/26/15  Yes Romero BellingSean Ellison, MD  insulin aspart (NOVOLOG FLEXPEN) 100 UNIT/ML FlexPen Inject 30 Units into the skin daily with supper. 04/01/16  Yes Romero BellingSean Ellison, MD  Insulin Glargine (LANTUS SOLOSTAR) 100 UNIT/ML Solostar Pen Inject 90 Units into the skin every morning. 04/01/16  Yes Romero BellingSean Ellison, MD  Liraglutide (VICTOZA) 18 MG/3ML SOPN Inject 0.3 mLs (1.8 mg total) into the skin daily. As discussed 07/26/15  Yes Romero BellingSean Ellison, MD  metFORMIN (GLUCOPHAGE-XR) 500 MG 24 hr tablet Take 4 tablets (2,000 mg total) by mouth daily with breakfast. 07/26/15  Yes Romero BellingSean Ellison, MD  Multiple Vitamins-Minerals (MULTIVITAMIN WITH MINERALS) tablet Take 1 tablet by mouth daily.   Yes Historical Provider, MD  mupirocin ointment (BACTROBAN) 2 % Apply 1 application topically 3 (three) times daily. 12/26/15  Yes Stephanie D English, PA  naproxen (NAPROSYN) 500 MG tablet Take 1 tablet (500 mg total) by mouth 2 (two) times daily. 10/20/15  Yes Shawn C Joy, PA-C  polyethylene glycol powder (GLYCOLAX/MIRALAX) powder Mix 17 gms in 8 oz of H2O bid for 5-7 days Patient taking differently: PRN 02/26/15  Yes Collie SiadStephanie D English, PA  PROAIR HFA 108 (90  Base) MCG/ACT inhaler INHALE 2 PUFFS INTO THE LUNGS EVERY 6 HOURS AS NEEDED FOR WHEEZING OR SHORTNESS OF BREATH 11/20/15  Yes Collie SiadStephanie D English, PA  venlafaxine XR (EFFEXOR XR) 150 MG 24 hr capsule Take 1 capsule (150 mg total) by mouth daily with breakfast. 12/26/15  Yes Collie SiadStephanie D English, PA    No Known Allergies  Patient Active Problem List   Diagnosis Date Noted  . Type 2 diabetes mellitus with diabetic neuropathy (HCC) 10/08/2014    Past Medical History:  Diagnosis Date  . Anemia   . Anxiety   . Asthma   . Depression   . Diabetes mellitus without complication [facility](HCC)     Past Surgical History:  Procedure Laterality Date  . CHOLECYSTECTOMY      Social History   Social History  . Marital status: Divorced    Spouse name: N/A  . Number of children: N/A  . Years of education: N/A   Occupational History  . Not on file.   Social History Main Topics  . Smoking status: Never Smoker  . Smokeless tobacco: Never Used  . Alcohol use No  . Drug use: No  . Sexual activity: No   Other Topics Concern  . Not on file   Social History Narrative  . No narrative on file    Family History  Problem Relation Age of Onset  . Hypertension Father   . Diabetes Maternal Grandmother   .  Mental illness Maternal Grandfather   . Diabetes Paternal Grandmother   . Hypertension Paternal Grandmother   . Heart disease Paternal Grandfather   . Diabetes Brother   . Mental illness Brother      Review of Systems  Constitutional: Negative.  Negative for chills, fever and malaise/fatigue.  HENT: Negative.  Negative for nosebleeds, sinus pain and sore throat.   Eyes: Negative.  Negative for blurred vision, double vision, discharge and redness.  Respiratory: Negative.  Negative for cough, hemoptysis, shortness of breath and wheezing.   Cardiovascular: Negative.  Negative for chest pain, palpitations, claudication and leg swelling.  Gastrointestinal: Negative.  Negative for abdominal pain,  diarrhea, heartburn, nausea and vomiting.  Genitourinary: Negative.  Negative for dysuria and hematuria.  Musculoskeletal: Negative.  Negative for back pain, myalgias and neck pain.  Skin: Negative.  Negative for rash.  Neurological: Positive for headaches. Negative for dizziness, sensory change, focal weakness and loss of consciousness.  Psychiatric/Behavioral: Positive for depression (History of, and medicated). The patient is nervous/anxious (several high stressors).   All other systems reviewed and are negative.  Vitals:   04/06/16 1346  BP: (!) 144/94  Pulse: 62  Resp: 17  Temp: 98.1 F (36.7 C)     Physical Exam  Constitutional: She is oriented to person, place, and time. She appears well-developed.  Obese.  HENT:  Head: Normocephalic and atraumatic.  Nose: Nose normal.  Mouth/Throat: Oropharynx is clear and moist.  Eyes: Conjunctivae and EOM are normal. Pupils are equal, round, and reactive to light.  Neck: Normal range of motion. Neck supple. No JVD present. No thyromegaly present.  Cardiovascular: Normal rate, regular rhythm, normal heart sounds and intact distal pulses.   Pulmonary/Chest: Effort normal and breath sounds normal.  Abdominal: Soft. There is no tenderness.  Musculoskeletal: She exhibits edema (LE).  Lymphadenopathy:    She has no cervical adenopathy.  Neurological: She is alert and oriented to person, place, and time. No sensory deficit. She exhibits normal muscle tone.  Skin: Skin is warm and dry. Capillary refill takes less than 2 seconds.  Psychiatric: She has a normal mood and affect. Her behavior is normal.  Vitals reviewed.    ASSESSMENT & PLAN:  [name] was seen today for hypertension.  Diagnoses and all orders for this visit:  Benign hypertension -     lisinopril (PRINIVIL,ZESTRIL) 10 MG tablet; Take 1 tablet (10 mg total) by mouth daily.  Type 2 diabetes mellitus with diabetic neuropathy, with long-term current use of insulin  (HCC)  Anxiety and depression    Patient Instructions       IF you received an x-ray today, you will receive an invoice from [facility]. Please contact [facility] at [phone] with questions or concerns regarding your invoice.   IF you received labwork today, you will receive an invoice from [place]. Please contact [facility] at [phone] with questions or concerns regarding your invoice.   Our billing staff will not be able to assist you with questions regarding bills from these companies.  You will be contacted with the lab results as soon as they are available. The fastest way to get your results is to activate your My Chart account. Instructions are located on the last page of this paperwork. If you have not heard from [place] regarding the results in 2 weeks, please contact this office.         Hypertension Hypertension is another name for high blood pressure. High blood pressure forces your heart to work  harder to pump blood. A blood pressure reading has two numbers, which includes a higher number over a lower number (example: 110/72). Follow these instructions at home:  Have your blood pressure rechecked by your doctor.  Only take medicine as told by your doctor. Follow the directions carefully. The medicine does not work as well if you skip doses. Skipping doses also puts you at risk for problems.  Do not smoke.  Monitor your blood pressure at home as told by your doctor. Contact a doctor if:  You think you are having a reaction to the medicine you are taking.  You have repeat headaches or feel dizzy.  You have puffiness (swelling) in your ankles.  You have trouble with your vision. Get help right away if:  You get a very bad headache and are confused.  You feel weak, numb, or faint.  You get chest or belly (abdominal) pain.  You throw up (vomit).  You cannot breathe very well. This information is not intended to replace advice  given to you by your health care provider. Make sure you discuss any questions you have with your health care provider. Document Released: 07/29/2007 Document Revised: 07/18/2015 Document Reviewed: 12/02/2012 Elsevier Interactive Patient Education  2017 Elsevier Inc.   Low-Sodium Eating Plan Sodium raises blood pressure and causes water to be held in the body. Getting less sodium from food will help lower your blood pressure, reduce any swelling, and protect your heart, liver, and kidneys. We get sodium by adding salt (sodium chloride) to food. Most of our sodium comes from canned, boxed, and frozen foods. Restaurant foods, fast foods, and pizza are also very high in sodium. Even if you take medicine to lower your blood pressure or to reduce fluid in your body, getting less sodium from your food is important. What is my plan? Most people should limit their sodium intake to 2,300 mg a day. Your health care provider recommends that you limit your sodium intake to __________ a day. What do I need to know about this eating plan? For the low-sodium eating plan, you will follow these general guidelines:  Choose foods with a % Daily Value for sodium of less than 5% (as listed on the food label).  Use salt-free seasonings or herbs instead of table salt or sea salt.  Check with your health care provider or pharmacist before using salt substitutes.  Eat fresh foods.  Eat more vegetables and fruits.  Limit canned vegetables. If you do use them, rinse them well to decrease the sodium.  Limit cheese to 1 oz (28 g) per day.  Eat lower-sodium products, often labeled as "lower sodium" or "no salt added."  Avoid foods that contain monosodium glutamate (MSG). MSG is sometimes added to [place] food and some canned foods.  Check food labels (Nutrition Facts labels) on foods to learn how much sodium is in one serving.  Eat more home-cooked food and less restaurant, buffet, and fast food.  When eating  at a restaurant, ask that your food be prepared with less salt, or no salt if possible. How do I read food labels for sodium information? The Nutrition Facts label lists the amount of sodium in one serving of the food. If you eat more than one serving, you must multiply the listed amount of sodium by the number of servings. Food labels may also identify foods as:  Sodium free-Less than 5 mg in a serving.  Very low sodium-35 mg or less in a serving.  Low  sodium-140 mg or less in a serving.  Light in sodium-50% less sodium in a serving. For example, if a food that usually has 300 mg of sodium is changed to become light in sodium, it will have 150 mg of sodium.  Reduced sodium-25% less sodium in a serving. For example, if a food that usually has 400 mg of sodium is changed to reduced sodium, it will have 300 mg of sodium. What foods can I eat? Grains  Low-sodium cereals, including oats, puffed wheat and rice, and shredded wheat cereals. Low-sodium crackers. Unsalted rice and pasta. Lower-sodium bread. Vegetables  Frozen or fresh vegetables. Low-sodium or reduced-sodium canned vegetables. Low-sodium or reduced-sodium tomato sauce and paste. Low-sodium or reduced-sodium tomato and vegetable juices. Fruits  Fresh, frozen, and canned fruit. Fruit juice. Meat and Other Protein Products  Low-sodium canned tuna and salmon. Fresh or frozen meat, poultry, seafood, and fish. Lamb. Unsalted nuts. Dried beans, peas, and lentils without added salt. Unsalted canned beans. Homemade soups without salt. Eggs. Dairy  Milk. Soy milk. Ricotta cheese. Low-sodium or reduced-sodium cheeses. Yogurt. Condiments  Fresh and dried herbs and spices. Salt-free seasonings. Onion and garlic powders. Low-sodium varieties of mustard and ketchup. Fresh or refrigerated horseradish. Lemon juice. Fats and Oils  Reduced-sodium salad dressings. Unsalted butter. Other  Unsalted popcorn and pretzels. The items listed above may  not be a complete list of recommended foods or beverages. Contact your dietitian for more options.  What foods are not recommended? Grains  Instant hot cereals. Bread stuffing, pancake, and biscuit mixes. Croutons. Seasoned rice or pasta mixes. Noodle soup cups. Boxed or frozen macaroni and cheese. Self-rising flour. Regular salted crackers. Vegetables  Regular canned vegetables. Regular canned tomato sauce and paste. Regular tomato and vegetable juices. Frozen vegetables in sauces. Salted [place] fries. Olives. Rosita Fire. Relishes. Sauerkraut. Salsa. Meat and Other Protein Products  Salted, canned, smoked, spiced, or pickled meats, seafood, or fish. Bacon, ham, sausage, hot dogs, corned beef, chipped beef, and packaged luncheon meats. Salt pork. Jerky. Pickled herring. Anchovies, regular canned tuna, and sardines. Salted nuts. Dairy  Processed cheese and cheese spreads. Cheese curds. Blue cheese and cottage cheese. Buttermilk. Condiments  Onion and garlic salt, seasoned salt, table salt, and sea salt. Canned and packaged gravies. Worcestershire sauce. Tartar sauce. Barbecue sauce. Teriyaki sauce. Soy sauce, including reduced sodium. Steak sauce. Fish sauce. Oyster sauce. Cocktail sauce. Horseradish that you find on the shelf. Regular ketchup and mustard. Meat flavorings and tenderizers. Bouillon cubes. Hot sauce. Tabasco sauce. Marinades. Taco seasonings. Relishes. Fats and Oils  Regular salad dressings. Salted butter. Margarine. Ghee. Bacon fat. Other  Potato and tortilla chips. Corn chips and puffs. Salted popcorn and pretzels. Canned or dried soups. Pizza. Frozen entrees and pot pies. The items listed above may not be a complete list of foods and beverages to avoid. Contact your dietitian for more information.  This information is not intended to replace advice given to you by your health care provider. Make sure you discuss any questions you have with your health care provider. Document Released:  08/01/2001 Document Revised: 07/18/2015 Document Reviewed: 12/14/2012 Elsevier Interactive Patient Education  2017 Elsevier Inc.     Edwina Barth, MD [facility]

## 2016-05-06 ENCOUNTER — Ambulatory Visit (INDEPENDENT_AMBULATORY_CARE_PROVIDER_SITE_OTHER): Payer: BLUE CROSS/BLUE SHIELD | Admitting: Physician Assistant

## 2016-05-06 VITALS — BP 126/82 | HR 96 | Temp 98.2°F | Resp 18 | Ht 66.5 in | Wt 273.0 lb

## 2016-05-06 DIAGNOSIS — I10 Essential (primary) hypertension: Secondary | ICD-10-CM | POA: Diagnosis not present

## 2016-05-06 DIAGNOSIS — R05 Cough: Secondary | ICD-10-CM

## 2016-05-06 DIAGNOSIS — R350 Frequency of micturition: Secondary | ICD-10-CM | POA: Diagnosis not present

## 2016-05-06 DIAGNOSIS — T464X5A Adverse effect of angiotensin-converting-enzyme inhibitors, initial encounter: Secondary | ICD-10-CM | POA: Diagnosis not present

## 2016-05-06 DIAGNOSIS — B379 Candidiasis, unspecified: Secondary | ICD-10-CM

## 2016-05-06 DIAGNOSIS — N3000 Acute cystitis without hematuria: Secondary | ICD-10-CM

## 2016-05-06 DIAGNOSIS — R058 Other specified cough: Secondary | ICD-10-CM

## 2016-05-06 LAB — POC MICROSCOPIC URINALYSIS (UMFC): Mucus: ABSENT

## 2016-05-06 LAB — POCT URINALYSIS DIP (MANUAL ENTRY)
Bilirubin, UA: NEGATIVE
Blood, UA: NEGATIVE
Glucose, UA: NEGATIVE
Ketones, POC UA: NEGATIVE
Nitrite, UA: NEGATIVE
Protein Ur, POC: NEGATIVE
Spec Grav, UA: 1.02
Urobilinogen, UA: 0.2
pH, UA: 7

## 2016-05-06 MED ORDER — FLUCONAZOLE 150 MG PO TABS: 150.0000 mg | ORAL_TABLET | Freq: Once | ORAL | 0 refills | Status: AC

## 2016-05-06 MED ORDER — LOSARTAN POTASSIUM 25 MG PO TABS: 25.0000 mg | ORAL_TABLET | Freq: Every day | ORAL | 1 refills | Status: DC

## 2016-05-06 MED ORDER — NITROFURANTOIN MONOHYD MACRO 100 MG PO CAPS: 100.0000 mg | ORAL_CAPSULE | Freq: Two times a day (BID) | ORAL | 0 refills | Status: DC

## 2016-05-06 NOTE — Patient Instructions (Addendum)
Thank you for coming in today. I hope you feel we met your needs.  Feel free to call [facility] if you have any questions or further requests.  Please consider signing up for MyChart if you do not already have it, as this is a great way to communicate with me.  Best,  Whitney McVey, PA-C  We recommend that you schedule a mammogram for breast cancer screening. Typically, you do not need a referral to do this. Please contact a local imaging center to schedule your mammogram.  [facility] - [phone]  *ask for the Radiology Department [facility] - [phone] or [phone]  [facility] - [phone] [place] [phone] [facility] - [phone]  *ask for the [facility] - [phone]  *ask for the Radiology Department [facility] - [phone]  *ask for the [place] - [phone]   IF you received an x-ray today, you will receive an invoice from [facility]. Please contact [facility] at [phone] with questions or concerns regarding your invoice.   IF you received labwork today, you will receive an invoice from [place]. Please contact [facility] at [phone] with questions or concerns regarding your invoice.   Our billing staff will not be able to assist you with questions regarding bills from these companies.  You will be contacted with the lab results as soon as they are available. The fastest way to get your results is to activate your My Chart account. Instructions are located on the last page of this paperwork. If you have not heard from [place] regarding the results in 2 weeks, please contact this office.

## 2016-05-06 NOTE — Progress Notes (Signed)
Shelby BrackettShann Scott  MRN: [id number] DOB: Jul 31, 1965  PCP: Shelby PlattStephanie English, PA  Subjective:  Pt is a 51 year old female PMH type 2 diabetes who presents to clinic for urinary frequency and urgency x 2 days.  Usually takes cranberry and it's fine. Her urine is "foamy".  Denies back pain, flank pain, abdominal pain, fever, chills She often gets "raging yeast infection" when she takes antibiotics.   H/o HTN - she was started on Lisinopril 10mg  qd on 03/2016. C/o dry cough since she started this medication. Other than this, she felt well on it. Her last dose was about 3 weeks ago.  Today's pressure is 126/82.  Denies chest pain, headache, vision changes, SOB.   Review of Systems  Constitutional: Negative for chills, fatigue and fever.  Respiratory: Negative for cough, shortness of breath and wheezing.   Cardiovascular: Negative for chest pain and palpitations.  Gastrointestinal: Negative for abdominal pain, diarrhea, nausea and vomiting.  Genitourinary: Positive for frequency and urgency. Negative for decreased urine volume, difficulty urinating, dysuria, enuresis, flank pain and hematuria.  Musculoskeletal: Negative for back pain.  Neurological: Negative for dizziness, weakness, light-headedness and headaches.    Patient Active Problem List   Diagnosis Date Noted  . Type 2 diabetes mellitus with diabetic neuropathy (HCC) 10/08/2014    Current Outpatient Prescriptions on File Prior to Visit  Medication Sig Dispense Refill  . ALPHA-LIPOIC ACID PO Take by mouth 2 (two) times daily.     . Biotin 1000 MCG tablet Take 1,000 mcg by mouth daily.     Marland Kitchen. gabapentin (NEURONTIN) 300 MG capsule TAKE 1 CAPSULE(300 MG) BY MOUTH THREE TIMES DAILY 90 capsule 5  . glucose blood (ONE TOUCH ULTRA TEST) test strip 1 each by Other route 2 (two) times daily. And lancets 2/day 100 each 12  . insulin aspart (NOVOLOG FLEXPEN) 100 UNIT/ML FlexPen Inject 30 Units into the skin daily with supper. 15 mL 2  . Insulin  Glargine (LANTUS SOLOSTAR) 100 UNIT/ML Solostar Pen Inject 90 Units into the skin every morning. 15 pen 11  . Liraglutide (VICTOZA) 18 MG/3ML SOPN Inject 0.3 mLs (1.8 mg total) into the skin daily. As discussed 18 mL 3  . lisinopril (PRINIVIL,ZESTRIL) 10 MG tablet Take 1 tablet (10 mg total) by mouth daily. 30 tablet 3  . metFORMIN (GLUCOPHAGE-XR) 500 MG 24 hr tablet Take 4 tablets (2,000 mg total) by mouth daily with breakfast. 360 tablet 3  . Multiple Vitamins-Minerals (MULTIVITAMIN WITH MINERALS) tablet Take 1 tablet by mouth daily.    . mupirocin ointment (BACTROBAN) 2 % Apply 1 application topically 3 (three) times daily. 22 g 1  . naproxen (NAPROSYN) 500 MG tablet Take 1 tablet (500 mg total) by mouth 2 (two) times daily. 30 tablet 0  . polyethylene glycol powder (GLYCOLAX/MIRALAX) powder Mix 17 gms in 8 oz of H2O bid for 5-7 days (Patient taking differently: PRN) 289 g 0  . PROAIR HFA 108 (90 Base) MCG/ACT inhaler INHALE 2 PUFFS INTO THE LUNGS EVERY 6 HOURS AS NEEDED FOR WHEEZING OR SHORTNESS OF BREATH 8.5 g 0  . venlafaxine XR (EFFEXOR XR) 150 MG 24 hr capsule Take 1 capsule (150 mg total) by mouth daily with breakfast. 30 capsule 5   No current facility-administered medications on file prior to visit.     No Known Allergies   Objective:  BP 126/82 (BP Location: Right Arm, Patient Position: Sitting, Cuff Size: Large)   Pulse 96   Temp 98.2 F (36.8 C) (Oral)  Resp 18   Ht 5' 6.5" (1.689 m)   Wt 273 lb (123.8 kg)   LMP 03/04/2016   SpO2 97%   BMI 43.40 kg/m   Physical Exam  Constitutional: She is oriented to person, place, and time and well-developed, well-nourished, and in no distress. No distress.  Cardiovascular: Normal rate, regular rhythm and normal heart sounds.   Abdominal: Soft. Normal appearance. There is no tenderness. There is no CVA tenderness.  Neurological: She is alert and oriented to person, place, and time. GCS score is 15.  Skin: Skin is warm and dry.    Psychiatric: Mood, memory, affect and judgment normal.  Vitals reviewed.   Results for orders placed or performed in visit on 05/06/16  POCT urinalysis dipstick  Result Value Ref Range   Color, UA yellow yellow   Clarity, UA clear clear   Glucose, UA negative negative   Bilirubin, UA negative negative   Ketones, POC UA negative negative   Spec Grav, UA 1.020 1.003, 1.005, 1.010, 1.015, 1.020, 1.025, 1.030, 1.035   Blood, UA negative negative   pH, UA 7.0 5.0, 5.5, 6.0, 6.5, 7.0, 7.5, 8.0   Protein Ur, POC negative negative   Urobilinogen, UA 0.2 0.2, 1.0, negative   Nitrite, UA Negative Negative   Leukocytes, UA Trace (A) Negative  POCT Microscopic Urinalysis ([facility])  Result Value Ref Range   WBC,UR,HPF,POC Few (A) None WBC/hpf   RBC,UR,HPF,POC None None RBC/hpf   Bacteria None None, Too numerous to count   Mucus Absent Absent   Epithelial Cells, UR Per Microscopy None None, Too numerous to count cells/hpf    Assessment and Plan :  1. Acute cystitis without hematuria 2. Urinary frequency 3. Yeast infection - nitrofurantoin, macrocrystal-monohydrate, (MACROBID) 100 MG capsule; Take 1 capsule (100 mg total) by mouth 2 (two) times daily.  Dispense: 20 capsule; Refill: 0 - POCT urinalysis dipstick - POCT Microscopic Urinalysis ([facility]) - Urine culture - fluconazole (DIFLUCAN) 150 MG tablet; Take 1 tablet (150 mg total) by mouth once. Repeat if needed  Dispense: 2 tablet; Refill: 0 - OK to give diflucan for possible yeast infection due to antibiotics. UTI prevention discussed. RTC if symptoms persist/worsen in 5-7 days.   4. Essential hypertension 5. Cough due to ACE inhibitor - losartan (COZAAR) 25 MG tablet; Take 1 tablet (25 mg total) by mouth daily.  Dispense: 30 tablet; Refill: 1 - Will change therapy from ACE to ARB. F/u in 1 month for recheck.   Marco Collie, PA-C  Primary Care at [facility] 05/06/2016 5:25 PM

## 2016-05-08 LAB — URINE CULTURE

## 2016-05-12 ENCOUNTER — Ambulatory Visit: Payer: Self-pay | Admitting: Surgery

## 2016-05-12 DIAGNOSIS — E1140 Type 2 diabetes mellitus with diabetic neuropathy, unspecified: Secondary | ICD-10-CM | POA: Diagnosis not present

## 2016-05-12 NOTE — H&P (Signed)
Chief Complaint:  Morbid obesity and DM  History of Present Illness:  Shelby Scott is an 50 y.o. female who presented to [facility] office and was initially seen by Dr. Kinsinger in May 2017.  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  She has in addition to DM she has peripheral neuropathy.  I have explained the procedure to her in detail and she is ready to proceed for gastric bypass  Past Medical History:  Diagnosis Date  . Anemia   . Anxiety   . Asthma   . Depression   . Diabetes mellitus without complication (HCC)     Past Surgical History:  Procedure Laterality Date  . CHOLECYSTECTOMY      Current Outpatient Prescriptions  Medication Sig Dispense Refill  . ALPHA-LIPOIC ACID PO Take by mouth 2 (two) times daily.     . Biotin 1000 MCG tablet Take 1,000 mcg by mouth daily.     . gabapentin (NEURONTIN) 300 MG capsule TAKE 1 CAPSULE(300 MG) BY MOUTH THREE TIMES DAILY 90 capsule 5  . glucose blood (ONE TOUCH ULTRA TEST) test strip 1 each by Other route 2 (two) times daily. And lancets 2/day 100 each 12  . insulin aspart (NOVOLOG FLEXPEN) 100 UNIT/ML FlexPen Inject 30 Units into the skin daily with supper. 15 mL 2  . Insulin Glargine (LANTUS SOLOSTAR) 100 UNIT/ML Solostar Pen Inject 90 Units into the skin every morning. 15 pen 11  . Liraglutide (VICTOZA) 18 MG/3ML SOPN Inject 0.3 mLs (1.8 mg total) into the skin daily. As discussed 18 mL 3  . losartan (COZAAR) 25 MG tablet Take 1 tablet (25 mg total) by mouth daily. 30 tablet 1  . metFORMIN (GLUCOPHAGE-XR) 500 MG 24 hr tablet Take 4 tablets (2,000 mg total) by mouth daily with breakfast. 360 tablet 3  . Multiple Vitamins-Minerals (MULTIVITAMIN WITH MINERALS) tablet Take 1 tablet by mouth daily.    . mupirocin ointment (BACTROBAN) 2 % Apply 1 application topically 3 (three) times daily. 22 g 1  . naproxen (NAPROSYN) 500 MG tablet Take 1 tablet (500 mg total) by mouth 2 (two) times daily. 30 tablet 0  . nitrofurantoin,  macrocrystal-monohydrate, (MACROBID) 100 MG capsule Take 1 capsule (100 mg total) by mouth 2 (two) times daily. 20 capsule 0  . polyethylene glycol powder (GLYCOLAX/MIRALAX) powder Mix 17 gms in 8 oz of H2O bid for 5-7 days (Patient taking differently: PRN) 289 g 0  . PROAIR HFA 108 (90 Base) MCG/ACT inhaler INHALE 2 PUFFS INTO THE LUNGS EVERY 6 HOURS AS NEEDED FOR WHEEZING OR SHORTNESS OF BREATH 8.5 g 0  . venlafaxine XR (EFFEXOR XR) 150 MG 24 hr capsule Take 1 capsule (150 mg total) by mouth daily with breakfast. 30 capsule 5   No current facility-administered medications for this visit.    Lisinopril Family History  Problem Relation Age of Onset  . Hypertension Father   . Diabetes Maternal Grandmother   . Mental illness Maternal Grandfather   . Diabetes Paternal Grandmother   . Hypertension Paternal Grandmother   . Heart disease Paternal Grandfather   . Diabetes Brother   . Mental illness Brother    Social History:   reports that she has never smoked. She has never used smokeless tobacco. She reports that she does not drink alcohol or use drugs.   REVIEW OF SYSTEMS : Negative except for see problem list  Physical Exam:   There were no vitals taken for this visit. There is no   height or weight on file to calculate BMI.  Gen:  WDWN WF NAD  Neurological: Alert and oriented to person, place, and time. Motor and sensory function is grossly intact  Head: Normocephalic and atraumatic.  Eyes: Conjunctivae are normal. Pupils are equal, round, and reactive to light. No scleral icterus.  Neck: Normal range of motion. Neck supple. No tracheal deviation or thyromegaly present.  Cardiovascular:  SR without murmurs or gallops.  No carotid bruits Breast:  Not examined Respiratory: Effort normal.  No respiratory distress. No chest wall tenderness. Breath sounds normal.  No wheezes, rales or rhonchi.  Abdomen:  nontender; scars from prior lap chole GU:  Not examined Musculoskeletal: Normal  range of motion. Extremities are nontender. No cyanosis, edema or clubbing noted Lymphadenopathy: No cervical, preauricular, postauricular or axillary adenopathy is present Skin: Skin is warm and dry. No rash noted. No diaphoresis. No erythema. No pallor. Pscyh: Normal mood and affect. Behavior is normal. Judgment and thought content normal.   LABORATORY RESULTS: No results found for this or any previous visit (from the past 48 hour(s)).   RADIOLOGY RESULTS: No results found.  Problem List: Patient Active Problem List   Diagnosis Date Noted  . Essential hypertension 05/06/2016  . Type 2 diabetes mellitus with diabetic neuropathy (HCC) 10/08/2014    Assessment & Plan: Type II DM and obesity-for gastric bypass    Matt B. Daphine DeutscherMartin, MD, [facility] Institute[facility]  [facility] [phone] beeper [phone]  05/12/2016 5:46 PM

## 2016-05-25 ENCOUNTER — Encounter: Payer: BLUE CROSS/BLUE SHIELD | Attending: Surgery | Admitting: Skilled Nursing Facility1

## 2016-05-25 ENCOUNTER — Encounter: Payer: Self-pay | Admitting: Skilled Nursing Facility1

## 2016-05-25 DIAGNOSIS — Z794 Long term (current) use of insulin: Secondary | ICD-10-CM

## 2016-05-25 DIAGNOSIS — E1140 Type 2 diabetes mellitus with diabetic neuropathy, unspecified: Secondary | ICD-10-CM | POA: Diagnosis not present

## 2016-05-25 DIAGNOSIS — Z713 Dietary counseling and surveillance: Secondary | ICD-10-CM | POA: Insufficient documentation

## 2016-05-25 DIAGNOSIS — F329 Major depressive disorder, single episode, unspecified: Secondary | ICD-10-CM | POA: Diagnosis not present

## 2016-05-25 DIAGNOSIS — Z6841 Body Mass Index (BMI) 40.0 and over, adult: Secondary | ICD-10-CM | POA: Insufficient documentation

## 2016-05-25 NOTE — Progress Notes (Signed)
  Pre-Operative Nutrition Class:  Appt start time: 6606   End time:  1830.  Patient was seen on 05/25/2016 for Pre-Operative Bariatric Surgery Education [facility].   Surgery date:  Surgery type:  [facility] at [facility]: 275 Weight today: 271.8  TANITA  BODY COMP RESULTS  05/25/2016   BMI (kg/m^2) 44.5   Fat Mass (lbs) 130.8   Fat Free Mass (lbs) 141   Total Body Water (lbs) 103.8   Samples given per MNT protocol. Patient educated on appropriate usage: Bariatric Advantage Calcium Citrate Lot #[id number] Exp: 06/17/2016  Premier Shake Lot # [id number] Exp: 12/26/2016  Opurity Multivitamin: Lot#: [id number]: 04/2017  The following the learning objectives were met by the patient during this course:  Identify Pre-Op Dietary Goals and will begin 2 weeks pre-operatively  Identify appropriate sources of fluids and proteins   State protein recommendations and appropriate sources pre and post-operatively  Identify Post-Operative Dietary Goals and will follow for 2 weeks post-operatively  Identify appropriate multivitamin and calcium sources  Describe the need for physical activity post-operatively and will follow MD recommendations  State when to call healthcare provider regarding medication questions or post-operative complications  Handouts given during class include:  Pre-Op Bariatric Surgery Diet Handout  Protein Shake Handout  Post-Op Bariatric Surgery Nutrition Handout  BELT Program Information Flyer  Support Group Information Flyer  [facility] Bariatric Supplements Price List  [facility]: [facility] per MD.

## 2016-06-02 NOTE — Patient Instructions (Addendum)
Hadessah Joshi  06/02/2016   Your procedure is scheduled on: 06-08-16  Report to [facility] Main  Entrance Take [place]  elevators to 3rd floor to  Short Stay Center at [phone].   Call this number if you have problems the morning of surgery [phone]    Remember: ONLY 1 PERSON MAY GO WITH YOU TO SHORT STAY TO GET  READY MORNING OF YOUR SURGERY.  Do not eat food or drink liquids :After Midnight.     Take these medicines the morning of surgery with A SIP OF WATER: gabapentin(neurontin), inhaler as needed (may bring to the hospital)                               You may not have any metal on your body including hair pins and              piercings  Do not wear jewelry, make-up, lotions, powders or perfumes, deodorant             Do not wear nail polish.  Do not shave  48 hours prior to surgery.     Do not bring valuables to the hospital. [place] IS NOT             RESPONSIBLE   FOR VALUABLES.  Contacts, dentures or bridgework may not be worn into surgery.  Leave suitcase in the car. After surgery it may be brought to your room.                Please read over the following fact sheets you were given: _____________________________________________________________________             How to Manage Your Diabetes Before and After Surgery  Why is it important to control my blood sugar before and after surgery? . Improving blood sugar levels before and after surgery helps healing and can limit problems. . A way of improving blood sugar control is eating a healthy diet by: o  Eating less sugar and carbohydrates o  Increasing activity/exercise o  Talking with your doctor about reaching your blood sugar goals . High blood sugars (greater than 180 mg/dL) can raise your risk of infections and slow your recovery, so you will need to focus on controlling your diabetes during the weeks before surgery. . Make sure that the doctor who takes care of your diabetes knows  about your planned surgery including the date and location.  How do I manage my blood sugar before surgery? . Check your blood sugar at least 4 times a day, starting 2 days before surgery, to make sure that the level is not too high or low. o Check your blood sugar the morning of your surgery when you wake up and every 2 hours until you get to the Short Stay unit. . If your blood sugar is less than 70 mg/dL, you will need to treat for low blood sugar: o Do not take insulin. o Treat a low blood sugar (less than 70 mg/dL) with  cup of clear juice (cranberry or apple), 4 glucose tablets, OR glucose gel. o Recheck blood sugar in 15 minutes after treatment (to make sure it is greater than 70 mg/dL). If your blood sugar is not greater than 70 mg/dL on recheck, call [phone] for further instructions. . Report your blood sugar to the short stay nurse  when you get to Short Stay.  . If you are admitted to the hospital after surgery: o Your blood sugar will be checked by the staff and you will probably be given insulin after surgery (instead of oral diabetes medicines) to make sure you have good blood sugar levels. o The goal for blood sugar control after surgery is 80-180 mg/dL.   WHAT DO I DO ABOUT MY DIABETES MEDICATION?  Marland Kitchen Do not take oral diabetes medicines (pills) the morning of surgery.  . THE DAY BEFORE SURGERY 06-07-16,       Take NOVOLOG insulin as usual  Take LANTUS insulin as usual in the morning ; take only 20units at bedtime  Take VICTOZA as usual   Take METFORMIN as usual  . THE MORNING OF SURGERY 06-08-16,   If your CBG is greater than 220 mg/dL, you may only  take  of your sliding scale (correction) dose of NOVOLOG  Insulin  Take only 20 units of LANTUS insulin  Do not take Victoza  Do not take METFORMIN    Patient Signature:  Date:   Nurse Signature:  Date:   Reviewed and Endorsed by [facility] Patient Education Committee, August 2015  [facility] - Preparing for  Surgery Before surgery, you can play an important role.  Because skin is not sterile, your skin needs to be as free of germs as possible.  You can reduce the number of germs on your skin by washing with CHG (chlorahexidine gluconate) soap before surgery.  CHG is an antiseptic cleaner which kills germs and bonds with the skin to continue killing germs even after washing. Please DO NOT use if you have an allergy to CHG or antibacterial soaps.  If your skin becomes reddened/irritated stop using the CHG and inform your nurse when you arrive at Short Stay. Do not shave (including legs and underarms) for at least 48 hours prior to the first CHG shower.  You may shave your face/neck. Please follow these instructions carefully:  1.  Shower with CHG Soap the night before surgery and the  morning of Surgery.  2.  If you choose to wash your hair, wash your hair first as usual with your  normal  shampoo.  3.  After you shampoo, rinse your hair and body thoroughly to remove the  shampoo.                           4.  Use CHG as you would any other liquid soap.  You can apply chg directly  to the skin and wash                       Gently with a scrungie or clean washcloth.  5.  Apply the CHG Soap to your body ONLY FROM THE NECK DOWN.   Do not use on face/ open                           Wound or open sores. Avoid contact with eyes, ears mouth and genitals (private parts).                       Wash face,  Genitals (private parts) with your normal soap.             6.  Wash thoroughly, paying special attention to the area where your surgery  will be  performed.  7.  Thoroughly rinse your body with warm water from the neck down.  8.  DO NOT shower/wash with your normal soap after using and rinsing off  the CHG Soap.                9.  Pat yourself dry with a clean towel.            10.  Wear clean pajamas.            11.  Place clean sheets on your bed the night of your first shower and do not  sleep with pets. Day  of Surgery : Do not apply any lotions/deodorants the morning of surgery.  Please wear clean clothes to the hospital/surgery center.  FAILURE TO FOLLOW THESE INSTRUCTIONS MAY RESULT IN THE CANCELLATION OF YOUR SURGERY PATIENT SIGNATURE_________________________________  NURSE SIGNATURE__________________________________  ________________________________________________________________________

## 2016-06-02 NOTE — Progress Notes (Signed)
U/A 05-06-16 epic EKG 07-25-15 epic

## 2016-06-04 ENCOUNTER — Encounter (HOSPITAL_COMMUNITY)
Admission: RE | Admit: 2016-06-04 | Discharge: 2016-06-04 | Disposition: A | Discharge status: Home / Self Care | Payer: BLUE CROSS/BLUE SHIELD | Source: Ambulatory Visit | Attending: Surgery | Admitting: Surgery

## 2016-06-04 ENCOUNTER — Encounter (HOSPITAL_COMMUNITY): Payer: Self-pay

## 2016-06-04 DIAGNOSIS — Z6841 Body Mass Index (BMI) 40.0 and over, adult: Secondary | ICD-10-CM | POA: Diagnosis not present

## 2016-06-04 DIAGNOSIS — E119 Type 2 diabetes mellitus without complications: Secondary | ICD-10-CM | POA: Diagnosis not present

## 2016-06-04 DIAGNOSIS — Z01812 Encounter for preprocedural laboratory examination: Secondary | ICD-10-CM | POA: Insufficient documentation

## 2016-06-04 HISTORY — DX: Essential (primary) hypertension: I10

## 2016-06-04 LAB — CBC WITH DIFFERENTIAL/PLATELET
BASOS PCT: 0 %
Basophils Absolute: 0 10*3/uL (ref 0.0–0.1)
Eosinophils Absolute: 0.1 10*3/uL (ref 0.0–0.7)
Eosinophils Relative: 1 %
HEMATOCRIT: 37.8 % (ref 36.0–46.0)
Hemoglobin: 12.4 g/dL (ref 12.0–15.0)
LYMPHS ABS: 3.4 10*3/uL (ref 0.7–4.0)
Lymphocytes Relative: 34 %
MCH: 29.4 pg (ref 26.0–34.0)
MCHC: 32.8 g/dL (ref 30.0–36.0)
MCV: 89.6 fL (ref 78.0–100.0)
MONO ABS: 0.5 10*3/uL (ref 0.1–1.0)
MONOS PCT: 5 %
NEUTROS ABS: 6.1 10*3/uL (ref 1.7–7.7)
Neutrophils Relative %: 60 %
Platelets: 329 10*3/uL (ref 150–400)
RBC: 4.22 MIL/uL (ref 3.87–5.11)
RDW: 14.3 % (ref 11.5–15.5)
WBC: 10.1 10*3/uL (ref 4.0–10.5)

## 2016-06-04 LAB — COMPREHENSIVE METABOLIC PANEL
ALBUMIN: 4 g/dL (ref 3.5–5.0)
ALT: 33 U/L (ref 14–54)
ANION GAP: 10 (ref 5–15)
AST: 47 U/L — ABNORMAL HIGH (ref 15–41)
Alkaline Phosphatase: 85 U/L (ref 38–126)
BILIRUBIN TOTAL: 1 mg/dL (ref 0.3–1.2)
BUN: 17 mg/dL (ref 6–20)
CALCIUM: 9.6 mg/dL (ref 8.9–10.3)
CO2: 26 mmol/L (ref 22–32)
Chloride: 102 mmol/L (ref 101–111)
Creatinine, Ser: 0.92 mg/dL (ref 0.44–1.00)
GLUCOSE: 108 mg/dL — AB (ref 65–99)
POTASSIUM: 4.7 mmol/L (ref 3.5–5.1)
Sodium: 138 mmol/L (ref 135–145)
TOTAL PROTEIN: 8.2 g/dL — AB (ref 6.5–8.1)

## 2016-06-04 LAB — GLUCOSE, CAPILLARY: Glucose-Capillary: 128 mg/dL — ABNORMAL HIGH (ref 65–99)

## 2016-06-04 MED FILL — oxyCODONE HCL 5 MG/5ML SOLN: 5 | 2 days supply | Qty: 100 | Fill #0

## 2016-06-04 NOTE — Progress Notes (Signed)
   06/04/16 0829  OBSTRUCTIVE SLEEP APNEA  Have you ever been diagnosed with sleep apnea through a sleep study? No  Do you snore loudly (loud enough to be heard through closed doors)?  1  Do you often feel tired, fatigued, or sleepy during the daytime (such as falling asleep during driving or talking to someone)? 1  Has anyone observed you stop breathing during your sleep? 0  Do you have, or are you being treated for high blood pressure? 1  BMI more than 35 kg/m2? 1  Age > 50 (1-yes) 1  Neck circumference greater than:Female 16 inches or larger, Female 17inches or larger? 1  Female Gender (Yes=1) 0  Obstructive Sleep Apnea Score 6

## 2016-06-05 ENCOUNTER — Ambulatory Visit (INDEPENDENT_AMBULATORY_CARE_PROVIDER_SITE_OTHER): Payer: BLUE CROSS/BLUE SHIELD | Admitting: Endocrinology

## 2016-06-05 ENCOUNTER — Encounter: Payer: Self-pay | Admitting: Endocrinology

## 2016-06-05 VITALS — BP 132/84 | HR 95 | Ht 66.5 in | Wt 269.0 lb

## 2016-06-05 DIAGNOSIS — E1140 Type 2 diabetes mellitus with diabetic neuropathy, unspecified: Secondary | ICD-10-CM | POA: Diagnosis not present

## 2016-06-05 DIAGNOSIS — Z794 Long term (current) use of insulin: Secondary | ICD-10-CM | POA: Diagnosis not present

## 2016-06-05 LAB — HEMOGLOBIN A1C
HEMOGLOBIN A1C: 7.5 % — AB (ref 4.8–5.6)
Mean Plasma Glucose: 169 mg/dL

## 2016-06-05 NOTE — Progress Notes (Signed)
Subjective:    Patient ID: Shelby Scott, female    DOB: [birth year], 51 y.o.   MRN: [id number]  HPI Pt returns for f/u of diabetes mellitus: DM type: Insulin-requiring type 2 Dx'ed: 2005 Complications: polyneuropathy Therapy: insulin since 2013, metformin, and victoza GDM: never DKA: never Severe hypoglycemia: never. Pancreatitis: never.  Other: she takes 2 insulins both QD, after poor results with multiple daily injections.  Interval history: no cbg record, but states cbg's are well-controlled.  She denies hypoglycemia.  She will have weight loss surgery in 3 days.  Past Medical History:  Diagnosis Date  . Anemia   . Anxiety   . Asthma   . Depression   . Diabetes mellitus without complication (HCC)   . Hypertension     Past Surgical History:  Procedure Laterality Date  . CHOLECYSTECTOMY      Social History   Social History  . Marital status: Divorced    Spouse name: N/A  . Number of children: N/A  . Years of education: N/A   Occupational History  . Not on file.   Social History Main Topics  . Smoking status: Never Smoker  . Smokeless tobacco: Never Used  . Alcohol use No  . Drug use: No  . Sexual activity: No   Other Topics Concern  . Not on file   Social History Narrative  . No narrative on file    Current Outpatient Prescriptions on File Prior to Visit  Medication Sig Dispense Refill  . ACETYLCARN-ALPHA LIPOIC ACID PO Take 1 tablet by mouth 2 (two) times daily.    . Adapalene-Benzoyl Peroxide (EPIDUO EX) Apply 1 application topically at bedtime.    . B Complex-C (B-COMPLEX WITH VITAMIN C) tablet Take 1 tablet by mouth daily.    . Biotin 1000 MCG tablet Take 1,000 mcg by mouth 2 (two) times daily.     . Cholecalciferol (VITAMIN D-3) 5000 units TABS Take 5,000 Units by mouth at bedtime.    . gabapentin (NEURONTIN) 300 MG capsule TAKE 1 CAPSULE(300 MG) BY MOUTH THREE TIMES DAILY (Patient taking differently: Take 300 mg by mouth 2 (two) times daily. ) 90  capsule 5  . glucose blood (ONE TOUCH ULTRA TEST) test strip 1 each by Other route 2 (two) times daily. And lancets 2/day (Patient taking differently: 1 each by Other route daily. ) 100 each 12  . ibuprofen (ADVIL,MOTRIN) 200 MG tablet Take 400 mg by mouth every 8 (eight) hours as needed (for pain/headaches.).    Marland Kitchen insulin aspart (NOVOLOG FLEXPEN) 100 UNIT/ML FlexPen Inject 30 Units into the skin daily with supper. (Patient taking differently: Inject 10 Units into the skin daily before lunch. ) 15 mL 2  . Insulin Glargine (LANTUS SOLOSTAR) 100 UNIT/ML Solostar Pen Inject 90 Units into the skin every morning. (Patient taking differently: Inject 40 Units into the skin 2 (two) times daily. ) 15 pen 11  . Liraglutide (VICTOZA) 18 MG/3ML SOPN Inject 0.3 mLs (1.8 mg total) into the skin daily. As discussed (Patient taking differently: Inject 1.2 mg into the skin at bedtime. ) 18 mL 3  . losartan (COZAAR) 25 MG tablet Take 1 tablet (25 mg total) by mouth daily. 30 tablet 1  . metFORMIN (GLUCOPHAGE-XR) 500 MG 24 hr tablet Take 4 tablets (2,000 mg total) by mouth daily with breakfast. (Patient taking differently: Take 500 mg by mouth 2 (two) times daily. ) 360 tablet 3  . Multiple Vitamin (MULTIVITAMIN WITH MINERALS) TABS tablet Take 1 tablet by  mouth at bedtime.    . mupirocin ointment (BACTROBAN) 2 % Apply 1 application topically 3 (three) times daily. 22 g 1  . naproxen sodium (ANAPROX) 220 MG tablet Take 440 mg by mouth 2 (two) times daily as needed (for pain/headaches,).    . pantoprazole (PROTONIX) 40 MG tablet Take 40 mg by mouth daily.  2  . Polyethyl Glycol-Propyl Glycol (SYSTANE) 0.4-0.3 % GEL ophthalmic gel Place 1 application into both eyes 2 (two) times daily as needed (for dry/irritated eyes).    . polyethylene glycol powder (GLYCOLAX/MIRALAX) powder Mix 17 gms in 8 oz of H2O bid for 5-7 days (Patient taking differently: Take 17 g by mouth daily as needed (for constipation.). ) 289 g 0  . PROAIR  HFA 108 (90 Base) MCG/ACT inhaler INHALE 2 PUFFS INTO THE LUNGS EVERY 6 HOURS AS NEEDED FOR WHEEZING OR SHORTNESS OF BREATH 8.5 g 0  . ondansetron (ZOFRAN-ODT) 4 MG disintegrating tablet Take 4 mg by mouth every 6 (six) hours as needed for nausea.  0   No current facility-administered medications on file prior to visit.     Allergies  Allergen Reactions  . Lisinopril Cough    Family History  Problem Relation Age of Onset  . Hypertension Father   . Diabetes Maternal Grandmother   . Mental illness Maternal Grandfather   . Diabetes Paternal Grandmother   . Hypertension Paternal Grandmother   . Heart disease Paternal Grandfather   . Diabetes Brother   . Mental illness Brother     BP 132/84   Pulse 95   Ht 5' 6.5" (1.689 m)   Wt 269 lb (122 kg)   SpO2 97%   BMI 42.77 kg/m    Review of Systems She has lost 5 lbs.     Objective:   Physical Exam VITAL SIGNS:  See vs page.  GENERAL: no distress.  Pulses: dorsalis pedis intact bilat.   MSK: no deformity of the feet, except for bilat bunions.   CV: trace bilat leg edema.   Skin:  no ulcer on the feet.  normal color and temp on the feet.   Neuro: sensation is intact to touch on the feet, but decreased from normal.   Lab Results  Component Value Date   HGBA1C 7.5 (H) 06/04/2016      Assessment & Plan:  Obesity: she will have surgery soon.  Insulin-requiring type 2 DM: she'll be able to vastly reduce insulin when she leaves the hospital.  Patient Instructions  Please continue the same medications for now. Due to your surgery, you should go home off the metformin and victoza. We may need to resume these later.   check your blood sugar twice a day.  vary the time of day when you check, between before the 3 meals, and at bedtime.  also check if you have symptoms of your blood sugar being too high or too low.  please keep a record of the readings and bring it to your next appointment here (or you can bring the meter itself).   You can write it on any piece of paper.  please call us sooner if your blood sugar goes below 70, or if you have a lot of readings over 200.   Please come back for a follow-up appointment in 2 weeks.

## 2016-06-05 NOTE — Patient Instructions (Addendum)
Please continue the same medications for now. Due to your surgery, you should go home off the metformin and victoza. We may need to resume these later.   check your blood sugar twice a day.  vary the time of day when you check, between before the 3 meals, and at bedtime.  also check if you have symptoms of your blood sugar being too high or too low.  please keep a record of the readings and bring it to your next appointment here (or you can bring the meter itself).  You can write it on any piece of paper.  please call us sooner if your blood sugar goes below 70, or if you have a lot of readings over 200.   Please come back for a follow-up appointment in 2 weeks.

## 2016-06-06 ENCOUNTER — Ambulatory Visit (INDEPENDENT_AMBULATORY_CARE_PROVIDER_SITE_OTHER): Payer: BLUE CROSS/BLUE SHIELD | Admitting: Family Medicine

## 2016-06-06 VITALS — BP 119/76 | HR 88 | Temp 98.0°F | Resp 17 | Ht 66.5 in | Wt 267.4 lb

## 2016-06-06 DIAGNOSIS — F419 Anxiety disorder, unspecified: Secondary | ICD-10-CM | POA: Diagnosis not present

## 2016-06-06 DIAGNOSIS — F32A Depression, unspecified: Secondary | ICD-10-CM

## 2016-06-06 DIAGNOSIS — H9312 Tinnitus, left ear: Secondary | ICD-10-CM | POA: Diagnosis not present

## 2016-06-06 DIAGNOSIS — H6982 Other specified disorders of Eustachian tube, left ear: Secondary | ICD-10-CM

## 2016-06-06 DIAGNOSIS — F329 Major depressive disorder, single episode, unspecified: Secondary | ICD-10-CM | POA: Diagnosis not present

## 2016-06-06 MED ORDER — FLUTICASONE PROPIONATE 50 MCG/ACT NA SUSP: 2.0000 | Freq: Every day | NASAL | 2 refills | Status: DC

## 2016-06-06 MED ORDER — VENLAFAXINE HCL 75 MG PO TABS: 75.0000 mg | ORAL_TABLET | Freq: Two times a day (BID) | ORAL | 2 refills | Status: DC

## 2016-06-06 NOTE — Patient Instructions (Addendum)
Discontinue the time release Effexor and take the venlafaxine 75 mg one twice daily which you can crush.  It is okay to open the capsules of the gabapentin for your continued use of that.  If you have problems please contact us or return.  Use the fluticasone nose spray 2 sprays in each nostril twice daily for 5 days, then drop back to once daily. This is to try and open up the eustachian tube. If he keeps having symptoms we will refer you to an ENT doctor.  IF you received an x-ray today, you will receive an invoice from [facility]. Please contact [facility] at [phone] with questions or concerns regarding your invoice.   IF you received labwork today, you will receive an invoice from [facility]. Please contact [facility] at [phone] with questions or concerns regarding your invoice.   Our billing staff will not be able to assist you with questions regarding bills from these companies.  You will be contacted with the lab results as soon as they are available. The fastest way to get your results is to activate your My Chart account. Instructions are located on the last page of this paperwork. If you have not heard from [place] regarding the results in 2 weeks, please contact this office.

## 2016-06-06 NOTE — Progress Notes (Signed)
Patient ID: Shelby Scott, female    DOB: 08/30/1965  Age: 51 y.o. MRN: [id number]  Chief Complaint  Patient presents with  . Medication Problem    Scheduled for sugery on Monday & needs to discuss Venlofaxine & Gapabentin.    Subjective:   51 year old lady who is scheduled to undergo a gastric bypass procedure next week. She is on a couple of medications that she needs to determine if she can take them in a powder form. Incidentally the patient mentioned that her left ear has been popping and swelling over the last year. She has ringing in the ear.   Current allergies, medications, problem list, past/family and social histories reviewed.  Objective:  BP 119/76   Pulse 88   Temp 98 F (36.7 C) (Oral)   Resp 17   Ht 5' 6.5" (1.689 m)   Wt 267 lb 6 oz (121.3 kg)   SpO2 97%   BMI 42.51 kg/m   TMs appear entirely normal bilaterally.  Assessment & Plan:   Assessment: 1. Anxiety and depression   2. Eustachian tube dysfunction, left   3. Tinnitus of left ear       Plan: See instructions.  No orders of the defined types were placed in this encounter.   Meds ordered this encounter  Medications  . venlafaxine (EFFEXOR) 75 MG tablet    Sig: Take 1 tablet (75 mg total) by mouth 2 (two) times daily.    Dispense:  60 tablet    Refill:  2  . fluticasone (FLONASE) 50 MCG/ACT nasal spray    Sig: Place 2 sprays into both nostrils daily.    Dispense:  16 g    Refill:  2         Patient Instructions   Discontinue the time release Effexor and take the venlafaxine 75 mg one twice daily which you can crush.  It is okay to open the capsules of the gabapentin for your continued use of that.  If you have problems please contact us or return.  Use the fluticasone nose spray 2 sprays in each nostril twice daily for 5 days, then drop back to once daily. This is to try and open up the eustachian tube. If he keeps having symptoms we will refer you to an ENT doctor.  IF you  received an x-ray today, you will receive an invoice from [facility]. Please contact [facility] at [phone] with questions or concerns regarding your invoice.   IF you received labwork today, you will receive an invoice from [facility]. Please contact [facility] at [phone] with questions or concerns regarding your invoice.   Our billing staff will not be able to assist you with questions regarding bills from these companies.  You will be contacted with the lab results as soon as they are available. The fastest way to get your results is to activate your My Chart account. Instructions are located on the last page of this paperwork. If you have not heard from [place] regarding the results in 2 weeks, please contact this office.         No Follow-up on file.   Giovany Cosby, MD 06/06/2016

## 2016-06-07 ENCOUNTER — Encounter (HOSPITAL_COMMUNITY): Payer: Self-pay | Admitting: Anesthesiology

## 2016-06-07 NOTE — Anesthesia Preprocedure Evaluation (Addendum)
Anesthesia Evaluation  Patient identified by MRN, date of birth, ID band  Reviewed: Allergy & Precautions, NPO status , Patient's Chart, lab work & pertinent test results  Airway Mallampati: III       Dental  (+) Teeth Intact, Dental Advisory Given   Pulmonary asthma ,    breath sounds clear to auscultation       Cardiovascular hypertension, Pt. on medications negative cardio ROS   Rhythm:Regular Rate:Normal     Neuro/Psych PSYCHIATRIC DISORDERS Anxiety Depression negative neurological ROS     GI/Hepatic negative GI ROS, Neg liver ROS,   Endo/Other  negative endocrine ROSdiabetes, Type 2, Insulin Dependent, Oral Hypoglycemic Agents  Renal/GU negative Renal ROS  negative genitourinary   Musculoskeletal negative musculoskeletal ROS (+)   Abdominal   Peds negative pediatric ROS (+)  Hematology negative hematology ROS (+)   Anesthesia Other Findings Day of surgery medications reviewed with the patient.  Reproductive/Obstetrics negative OB ROS                            Lab Results  Component Value Date   WBC 10.1 06/04/2016   HGB 12.4 06/04/2016   HCT 37.8 06/04/2016   MCV 89.6 06/04/2016   PLT 329 06/04/2016   Lab Results  Component Value Date   CREATININE 0.92 06/04/2016   BUN 17 06/04/2016   NA 138 06/04/2016   K 4.7 06/04/2016   CL 102 06/04/2016   CO2 26 06/04/2016   No results found for: INR, PROTIME  EKG: normal sinus rhythm.   Anesthesia Physical Anesthesia Plan  ASA: III  Anesthesia Plan: General   Post-op Pain Management:    Induction: Intravenous  Airway Management Planned: Oral ETT  Additional Equipment:   Intra-op Plan:   Post-operative Plan: Extubation in OR  Informed Consent: I have reviewed the patients History and Physical, chart, labs and discussed the procedure including the risks, benefits and alternatives for the proposed anesthesia with the  patient or authorized representative who has indicated his/her understanding and acceptance.   Dental advisory given  Plan Discussed with: CRNA  Anesthesia Plan Comments:         Anesthesia Quick Evaluation

## 2016-06-08 ENCOUNTER — Encounter (HOSPITAL_COMMUNITY): Payer: Self-pay | Admitting: *Deleted

## 2016-06-08 ENCOUNTER — Encounter (HOSPITAL_COMMUNITY)
Admission: RE | Disposition: A | Discharge status: Home / Self Care | Payer: Self-pay | Source: Ambulatory Visit | Attending: Surgery

## 2016-06-08 ENCOUNTER — Inpatient Hospital Stay (HOSPITAL_COMMUNITY): Payer: BLUE CROSS/BLUE SHIELD | Admitting: Anesthesiology

## 2016-06-08 ENCOUNTER — Inpatient Hospital Stay (HOSPITAL_COMMUNITY)
Admission: RE | Admit: 2016-06-08 | Discharge: 2016-06-09 | DRG: 621 | Disposition: A | Discharge status: Home / Self Care | Payer: BLUE CROSS/BLUE SHIELD | Source: Ambulatory Visit | Attending: Surgery | Admitting: Surgery

## 2016-06-08 DIAGNOSIS — Z9884 Bariatric surgery status: Secondary | ICD-10-CM

## 2016-06-08 DIAGNOSIS — Z79899 Other long term (current) drug therapy: Secondary | ICD-10-CM | POA: Diagnosis not present

## 2016-06-08 DIAGNOSIS — Z833 Family history of diabetes mellitus: Secondary | ICD-10-CM

## 2016-06-08 DIAGNOSIS — Z818 Family history of other mental and behavioral disorders: Secondary | ICD-10-CM | POA: Diagnosis not present

## 2016-06-08 DIAGNOSIS — E1140 Type 2 diabetes mellitus with diabetic neuropathy, unspecified: Secondary | ICD-10-CM | POA: Diagnosis not present

## 2016-06-08 DIAGNOSIS — Z794 Long term (current) use of insulin: Secondary | ICD-10-CM

## 2016-06-08 DIAGNOSIS — F419 Anxiety disorder, unspecified: Secondary | ICD-10-CM | POA: Diagnosis not present

## 2016-06-08 DIAGNOSIS — I10 Essential (primary) hypertension: Secondary | ICD-10-CM | POA: Diagnosis not present

## 2016-06-08 DIAGNOSIS — F329 Major depressive disorder, single episode, unspecified: Secondary | ICD-10-CM | POA: Diagnosis not present

## 2016-06-08 DIAGNOSIS — Z6841 Body Mass Index (BMI) 40.0 and over, adult: Secondary | ICD-10-CM

## 2016-06-08 DIAGNOSIS — Z8249 Family history of ischemic heart disease and other diseases of the circulatory system: Secondary | ICD-10-CM | POA: Diagnosis not present

## 2016-06-08 DIAGNOSIS — J45909 Unspecified asthma, uncomplicated: Secondary | ICD-10-CM | POA: Diagnosis not present

## 2016-06-08 DIAGNOSIS — Z9049 Acquired absence of other specified parts of digestive tract: Secondary | ICD-10-CM

## 2016-06-08 DIAGNOSIS — E119 Type 2 diabetes mellitus without complications: Secondary | ICD-10-CM | POA: Diagnosis not present

## 2016-06-08 HISTORY — PX: UPPER GI ENDOSCOPY: SHX6162

## 2016-06-08 HISTORY — PX: GASTRIC ROUX-EN-Y: SHX5262

## 2016-06-08 LAB — CBC
HCT: 36.6 % (ref 36.0–46.0)
HEMOGLOBIN: 12 g/dL (ref 12.0–15.0)
MCH: 28.8 pg (ref 26.0–34.0)
MCHC: 32.8 g/dL (ref 30.0–36.0)
MCV: 87.8 fL (ref 78.0–100.0)
Platelets: 298 10*3/uL (ref 150–400)
RBC: 4.17 MIL/uL (ref 3.87–5.11)
RDW: 14 % (ref 11.5–15.5)
WBC: 14.9 10*3/uL — AB (ref 4.0–10.5)

## 2016-06-08 LAB — GLUCOSE, CAPILLARY
GLUCOSE-CAPILLARY: 122 mg/dL — AB (ref 65–99)
Glucose-Capillary: 197 mg/dL — ABNORMAL HIGH (ref 65–99)

## 2016-06-08 LAB — CREATININE, SERUM: CREATININE: 0.9 mg/dL (ref 0.44–1.00)

## 2016-06-08 LAB — PREGNANCY, URINE: PREG TEST UR: NEGATIVE

## 2016-06-08 SURGERY — LAPAROSCOPIC ROUX-EN-Y GASTRIC BYPASS WITH UPPER ENDOSCOPY
Anesthesia: General | Site: Abdomen

## 2016-06-08 MED ORDER — PROPOFOL 10 MG/ML IV BOLUS
INTRAVENOUS | Status: DC | PRN
  Administered 2016-06-08: 150 mg via INTRAVENOUS

## 2016-06-08 MED ORDER — HEPARIN SODIUM (PORCINE) 5000 UNIT/ML IJ SOLN
5000.0000 [IU] | INTRAMUSCULAR | Status: AC
  Administered 2016-06-08: 5000 [IU] via SUBCUTANEOUS
  Filled 2016-06-08: qty 1

## 2016-06-08 MED ORDER — EPHEDRINE SULFATE-NACL 50-0.9 MG/10ML-% IV SOSY
PREFILLED_SYRINGE | INTRAVENOUS | Status: DC | PRN
  Administered 2016-06-08 (×2): 10 mg via INTRAVENOUS
  Administered 2016-06-08: 5 mg via INTRAVENOUS
  Administered 2016-06-08: 10 mg via INTRAVENOUS
  Administered 2016-06-08: 5 mg via INTRAVENOUS

## 2016-06-08 MED ORDER — KETAMINE HCL 10 MG/ML IJ SOLN
INTRAMUSCULAR | Status: AC
  Filled 2016-06-08: qty 1

## 2016-06-08 MED ORDER — ONDANSETRON HCL 4 MG/2ML IJ SOLN
INTRAMUSCULAR | Status: AC
  Filled 2016-06-08: qty 2

## 2016-06-08 MED ORDER — ACETAMINOPHEN 160 MG/5ML PO SOLN
325.0000 mg | ORAL | Status: DC | PRN
  Administered 2016-06-09: 650 mg via ORAL
  Filled 2016-06-08: qty 20.3

## 2016-06-08 MED ORDER — LACTATED RINGERS IV SOLN
INTRAVENOUS | Status: DC | PRN
  Administered 2016-06-08 (×2): via INTRAVENOUS

## 2016-06-08 MED ORDER — LIDOCAINE 2% (20 MG/ML) 5 ML SYRINGE
INTRAMUSCULAR | Status: AC
  Filled 2016-06-08: qty 10

## 2016-06-08 MED ORDER — SCOPOLAMINE 1 MG/3DAYS TD PT72
1.0000 | MEDICATED_PATCH | TRANSDERMAL | Status: DC
  Administered 2016-06-08: 1.5 mg via TRANSDERMAL
  Filled 2016-06-08: qty 1

## 2016-06-08 MED ORDER — GABAPENTIN 300 MG PO CAPS
300.0000 mg | ORAL_CAPSULE | ORAL | Status: AC
  Administered 2016-06-08: 300 mg via ORAL
  Filled 2016-06-08: qty 1

## 2016-06-08 MED ORDER — ACETAMINOPHEN 325 MG PO TABS: 650.0000 mg | ORAL_TABLET | ORAL | Status: DC | PRN

## 2016-06-08 MED ORDER — FENTANYL CITRATE (PF) 250 MCG/5ML IJ SOLN
INTRAMUSCULAR | Status: AC
  Filled 2016-06-08: qty 5

## 2016-06-08 MED ORDER — KCL IN DEXTROSE-NACL 20-5-0.45 MEQ/L-%-% IV SOLN
INTRAVENOUS | Status: DC
  Administered 2016-06-08: 16:00:00 via INTRAVENOUS
  Administered 2016-06-09: 1000 mL via INTRAVENOUS
  Filled 2016-06-08 (×3): qty 1000

## 2016-06-08 MED ORDER — ROCURONIUM BROMIDE 50 MG/5ML IV SOSY
PREFILLED_SYRINGE | INTRAVENOUS | Status: AC
  Filled 2016-06-08: qty 5

## 2016-06-08 MED ORDER — APREPITANT 40 MG PO CAPS
40.0000 mg | ORAL_CAPSULE | ORAL | Status: AC
  Administered 2016-06-08: 40 mg via ORAL
  Filled 2016-06-08: qty 1

## 2016-06-08 MED ORDER — ROCURONIUM BROMIDE 50 MG/5ML IV SOSY
PREFILLED_SYRINGE | INTRAVENOUS | Status: AC
  Filled 2016-06-08: qty 10

## 2016-06-08 MED ORDER — FENTANYL CITRATE (PF) 100 MCG/2ML IJ SOLN: 25.0000 ug | INTRAMUSCULAR | Status: DC | PRN

## 2016-06-08 MED ORDER — CEFOTETAN DISODIUM-DEXTROSE 2-2.08 GM-% IV SOLR
2.0000 g | INTRAVENOUS | Status: AC
  Administered 2016-06-08: 2 g via INTRAVENOUS

## 2016-06-08 MED ORDER — SUGAMMADEX SODIUM 500 MG/5ML IV SOLN
INTRAVENOUS | Status: AC
  Filled 2016-06-08: qty 5

## 2016-06-08 MED ORDER — BUPIVACAINE LIPOSOME 1.3 % IJ SUSP
INTRAMUSCULAR | Status: DC | PRN
  Administered 2016-06-08: 20 mL

## 2016-06-08 MED ORDER — SUGAMMADEX SODIUM 500 MG/5ML IV SOLN
INTRAVENOUS | Status: DC | PRN
  Administered 2016-06-08: 300 mg via INTRAVENOUS

## 2016-06-08 MED ORDER — LIDOCAINE 2% (20 MG/ML) 5 ML SYRINGE
INTRAMUSCULAR | Status: DC | PRN
Start: 2016-06-08 — End: 2016-06-08
  Administered 2016-06-08: 50 mg via INTRAVENOUS

## 2016-06-08 MED ORDER — PROPOFOL 10 MG/ML IV BOLUS
INTRAVENOUS | Status: AC
  Filled 2016-06-08: qty 20

## 2016-06-08 MED ORDER — SODIUM CHLORIDE 0.9 % IJ SOLN
INTRAMUSCULAR | Status: AC
Start: 2016-06-08 — End: 2016-06-08
  Filled 2016-06-08: qty 10

## 2016-06-08 MED ORDER — PROMETHAZINE HCL 25 MG/ML IJ SOLN: 6.2500 mg | INTRAMUSCULAR | Status: DC | PRN

## 2016-06-08 MED ORDER — LACTATED RINGERS IV SOLN: INTRAVENOUS | Status: DC

## 2016-06-08 MED ORDER — PHENYLEPHRINE 40 MCG/ML (10ML) SYRINGE FOR IV PUSH (FOR BLOOD PRESSURE SUPPORT)
PREFILLED_SYRINGE | INTRAVENOUS | Status: DC | PRN
  Administered 2016-06-08: 40 ug via INTRAVENOUS

## 2016-06-08 MED ORDER — MEPERIDINE HCL 50 MG/ML IJ SOLN: 6.2500 mg | INTRAMUSCULAR | Status: DC | PRN

## 2016-06-08 MED ORDER — ACETAMINOPHEN 500 MG PO TABS
1000.0000 mg | ORAL_TABLET | ORAL | Status: AC
  Administered 2016-06-08: 1000 mg via ORAL
  Filled 2016-06-08: qty 2

## 2016-06-08 MED ORDER — CEFOTETAN DISODIUM-DEXTROSE 2-2.08 GM-% IV SOLR
INTRAVENOUS | Status: AC
  Filled 2016-06-08: qty 50

## 2016-06-08 MED ORDER — MIDAZOLAM HCL 2 MG/2ML IJ SOLN
INTRAMUSCULAR | Status: AC
Start: 2016-06-08 — End: 2016-06-08
  Filled 2016-06-08: qty 2

## 2016-06-08 MED ORDER — ONDANSETRON HCL 4 MG/2ML IJ SOLN: 4.0000 mg | INTRAMUSCULAR | Status: DC | PRN

## 2016-06-08 MED ORDER — METOCLOPRAMIDE HCL 5 MG/ML IJ SOLN
INTRAMUSCULAR | Status: AC
Start: 2016-06-08 — End: 2016-06-08
  Filled 2016-06-08: qty 2

## 2016-06-08 MED ORDER — SODIUM CHLORIDE 0.9 % IJ SOLN
INTRAMUSCULAR | Status: DC | PRN
  Administered 2016-06-08: 10 mL via INTRAVENOUS

## 2016-06-08 MED ORDER — METOCLOPRAMIDE HCL 5 MG/ML IJ SOLN
INTRAMUSCULAR | Status: DC | PRN
  Administered 2016-06-08: 10 mg via INTRAVENOUS

## 2016-06-08 MED ORDER — TISSEEL VH 10 ML EX KIT
PACK | CUTANEOUS | Status: AC
  Filled 2016-06-08: qty 1

## 2016-06-08 MED ORDER — HEPARIN SODIUM (PORCINE) 5000 UNIT/ML IJ SOLN
5000.0000 [IU] | Freq: Three times a day (TID) | INTRAMUSCULAR | Status: DC
  Administered 2016-06-08 – 2016-06-09 (×3): 5000 [IU] via SUBCUTANEOUS
  Filled 2016-06-08: qty 1

## 2016-06-08 MED ORDER — LIDOCAINE 2% (20 MG/ML) 5 ML SYRINGE
INTRAMUSCULAR | Status: DC | PRN
  Administered 2016-06-08: 1.5 mg/kg/h via INTRAVENOUS

## 2016-06-08 MED ORDER — EPHEDRINE 5 MG/ML INJ
INTRAVENOUS | Status: AC
Start: 2016-06-08 — End: 2016-06-08
  Filled 2016-06-08: qty 10

## 2016-06-08 MED ORDER — LIDOCAINE 2% (20 MG/ML) 5 ML SYRINGE
INTRAMUSCULAR | Status: AC
  Filled 2016-06-08: qty 5

## 2016-06-08 MED ORDER — DEXAMETHASONE SODIUM PHOSPHATE 10 MG/ML IJ SOLN
INTRAMUSCULAR | Status: AC
  Filled 2016-06-08: qty 1

## 2016-06-08 MED ORDER — 0.9 % SODIUM CHLORIDE (POUR BTL) OPTIME
TOPICAL | Status: DC | PRN
  Administered 2016-06-08: 1000 mL

## 2016-06-08 MED ORDER — KETAMINE HCL 10 MG/ML IJ SOLN
INTRAMUSCULAR | Status: DC | PRN
  Administered 2016-06-08: 30 mg via INTRAVENOUS

## 2016-06-08 MED ORDER — MIDAZOLAM HCL 5 MG/5ML IJ SOLN
INTRAMUSCULAR | Status: DC | PRN
  Administered 2016-06-08: 2 mg via INTRAVENOUS

## 2016-06-08 MED ORDER — LACTATED RINGERS IR SOLN
Status: DC | PRN
Start: 2016-06-08 — End: 2016-06-08
  Administered 2016-06-08: 1000 mL

## 2016-06-08 MED ORDER — FENTANYL CITRATE (PF) 100 MCG/2ML IJ SOLN
INTRAMUSCULAR | Status: DC | PRN
  Administered 2016-06-08: 50 ug via INTRAVENOUS
  Administered 2016-06-08: 100 ug via INTRAVENOUS
  Administered 2016-06-08 (×2): 50 ug via INTRAVENOUS

## 2016-06-08 MED ORDER — DEXAMETHASONE SODIUM PHOSPHATE 4 MG/ML IJ SOLN
4.0000 mg | INTRAMUSCULAR | Status: AC
  Administered 2016-06-08: 8 mg via INTRAVENOUS
  Filled 2016-06-08: qty 1

## 2016-06-08 MED ORDER — OXYCODONE HCL 5 MG/5ML PO SOLN: 5.0000 mg | ORAL | Status: DC | PRN

## 2016-06-08 MED ORDER — PHENYLEPHRINE 40 MCG/ML (10ML) SYRINGE FOR IV PUSH (FOR BLOOD PRESSURE SUPPORT)
PREFILLED_SYRINGE | INTRAVENOUS | Status: AC
  Filled 2016-06-08: qty 10

## 2016-06-08 MED ORDER — PREMIER PROTEIN SHAKE
2.0000 [oz_av] | ORAL | Status: DC
  Administered 2016-06-09 (×2): 2 [oz_av] via ORAL

## 2016-06-08 MED ORDER — ROCURONIUM BROMIDE 50 MG/5ML IV SOSY
PREFILLED_SYRINGE | INTRAVENOUS | Status: DC | PRN
  Administered 2016-06-08 (×2): 10 mg via INTRAVENOUS
  Administered 2016-06-08: 20 mg via INTRAVENOUS
  Administered 2016-06-08 (×3): 10 mg via INTRAVENOUS
  Administered 2016-06-08: 50 mg via INTRAVENOUS

## 2016-06-08 MED ORDER — PANTOPRAZOLE SODIUM 40 MG IV SOLR
40.0000 mg | Freq: Every day | INTRAVENOUS | Status: DC
Start: 2016-06-08 — End: 2016-06-09
  Administered 2016-06-08: 40 mg via INTRAVENOUS
  Filled 2016-06-08: qty 40

## 2016-06-08 MED ORDER — CELECOXIB 200 MG PO CAPS
400.0000 mg | ORAL_CAPSULE | ORAL | Status: AC
  Administered 2016-06-08: 400 mg via ORAL
  Filled 2016-06-08: qty 2

## 2016-06-08 MED ORDER — SCOPOLAMINE 1 MG/3DAYS TD PT72
MEDICATED_PATCH | TRANSDERMAL | Status: AC
  Filled 2016-06-08: qty 1

## 2016-06-08 MED ORDER — BUPIVACAINE LIPOSOME 1.3 % IJ SUSP
20.0000 mL | Freq: Once | INTRAMUSCULAR | Status: DC
  Filled 2016-06-08: qty 20

## 2016-06-08 MED ORDER — MORPHINE SULFATE (PF) 10 MG/ML IV SOLN
1.0000 mg | INTRAVENOUS | Status: DC | PRN
  Administered 2016-06-08 (×2): 2 mg via INTRAVENOUS
  Filled 2016-06-08 (×2): qty 1

## 2016-06-08 SURGICAL SUPPLY — 67 items
APPLICATOR COTTON TIP 6IN STRL (MISCELLANEOUS) ×3 IMPLANT
APPLIER CLIP ROT 10 11.4 M/L (STAPLE)
APPLIER CLIP ROT 13.4 12 LRG (CLIP)
BENZOIN TINCTURE PRP APPL 2/3 (GAUZE/BANDAGES/DRESSINGS) IMPLANT
BLADE SURG 15 STRL LF DISP TIS (BLADE) ×2 IMPLANT
BLADE SURG 15 STRL SS (BLADE) ×1
CABLE HIGH FREQUENCY MONO STRZ (ELECTRODE) ×3 IMPLANT
CLIP APPLIE ROT 10 11.4 M/L (STAPLE) IMPLANT
CLIP APPLIE ROT 13.4 12 LRG (CLIP) IMPLANT
CLIP SUT LAPRA TY ABSORB (SUTURE) ×6 IMPLANT
DERMABOND ADVANCED (GAUZE/BANDAGES/DRESSINGS) ×1
DERMABOND ADVANCED .7 DNX12 (GAUZE/BANDAGES/DRESSINGS) ×2 IMPLANT
DEVICE SUT QUICK LOAD TK 5 (STAPLE) IMPLANT
DEVICE SUT TI-KNOT TK 5X26 (MISCELLANEOUS) IMPLANT
DEVICE SUTURE ENDOST 10MM (ENDOMECHANICALS) ×3 IMPLANT
DISSECTOR BLUNT TIP ENDO 5MM (MISCELLANEOUS) IMPLANT
DRAIN PENROSE 18X1/4 LTX STRL (WOUND CARE) ×3 IMPLANT
GAUZE SPONGE 4X4 12PLY STRL (GAUZE/BANDAGES/DRESSINGS) IMPLANT
GAUZE SPONGE 4X4 16PLY XRAY LF (GAUZE/BANDAGES/DRESSINGS) ×3 IMPLANT
GLOVE BIOGEL M 8.0 STRL (GLOVE) ×3 IMPLANT
GOWN STRL REUS W/TWL XL LVL3 (GOWN DISPOSABLE) ×12 IMPLANT
HANDLE STAPLE EGIA 4 XL (STAPLE) ×3 IMPLANT
HOVERMATT SINGLE USE (MISCELLANEOUS) ×3 IMPLANT
IRRIG SUCT STRYKERFLOW 2 WTIP (MISCELLANEOUS)
IRRIGATION SUCT STRKRFLW 2 WTP (MISCELLANEOUS) IMPLANT
KIT BASIN OR (CUSTOM PROCEDURE TRAY) ×3 IMPLANT
KIT GASTRIC LAVAGE 34FR ADT (SET/KITS/TRAYS/PACK) ×3 IMPLANT
MARKER SKIN DUAL TIP RULER LAB (MISCELLANEOUS) ×3 IMPLANT
NEEDLE SPNL 22GX3.5 QUINCKE BK (NEEDLE) ×3 IMPLANT
PACK CARDIOVASCULAR III (CUSTOM PROCEDURE TRAY) ×3 IMPLANT
RELOAD EGIA 45 MED/THCK PURPLE (STAPLE) IMPLANT
RELOAD EGIA 45 TAN VASC (STAPLE) IMPLANT
RELOAD EGIA 60 MED/THCK PURPLE (STAPLE) ×12 IMPLANT
RELOAD EGIA 60 TAN VASC (STAPLE) ×3 IMPLANT
RELOAD ENDO STITCH 2.0 (ENDOMECHANICALS) ×10
RELOAD TRI 45 ART MED THCK PUR (STAPLE) ×3 IMPLANT
RELOAD TRI 60 ART MED THCK PUR (STAPLE) IMPLANT
SCISSORS LAP 5X45 EPIX DISP (ENDOMECHANICALS) ×3 IMPLANT
SEALANT SURGICAL APPL DUAL CAN (MISCELLANEOUS) ×3 IMPLANT
SET IRRIG TUBING LAPAROSCOPIC (IRRIGATION / IRRIGATOR) ×3 IMPLANT
SHEARS HARMONIC ACE PLUS 45CM (MISCELLANEOUS) ×3 IMPLANT
SLEEVE ADV FIXATION 12X100MM (TROCAR) ×6 IMPLANT
SLEEVE ADV FIXATION 5X100MM (TROCAR) IMPLANT
SOLUTION ANTI FOG 6CC (MISCELLANEOUS) ×3 IMPLANT
STAPLER VISISTAT 35W (STAPLE) ×3 IMPLANT
STRIP CLOSURE SKIN 1/2X4 (GAUZE/BANDAGES/DRESSINGS) IMPLANT
SUT RELOAD ENDO STITCH 2 48X1 (ENDOMECHANICALS) ×12
SUT RELOAD ENDO STITCH 2.0 (ENDOMECHANICALS) ×8
SUT SURGIDAC NAB ES-9 0 48 120 (SUTURE) IMPLANT
SUT VIC AB 2-0 SH 27 (SUTURE) ×1
SUT VIC AB 2-0 SH 27X BRD (SUTURE) ×2 IMPLANT
SUT VIC AB 4-0 SH 18 (SUTURE) IMPLANT
SUTURE RELOAD END STTCH 2 48X1 (ENDOMECHANICALS) ×12 IMPLANT
SUTURE RELOAD ENDO STITCH 2.0 (ENDOMECHANICALS) ×8 IMPLANT
SYR 10ML ECCENTRIC (SYRINGE) ×3 IMPLANT
SYR 20CC LL (SYRINGE) ×6 IMPLANT
SYR 50ML LL SCALE MARK (SYRINGE) ×3 IMPLANT
TOWEL OR 17X26 10 PK STRL BLUE (TOWEL DISPOSABLE) ×6 IMPLANT
TOWEL OR NON WOVEN STRL DISP B (DISPOSABLE) ×3 IMPLANT
TRAY FOLEY W/METER SILVER 16FR (SET/KITS/TRAYS/PACK) IMPLANT
TROCAR ADV FIXATION 12X100MM (TROCAR) ×3 IMPLANT
TROCAR ADV FIXATION 5X100MM (TROCAR) ×3 IMPLANT
TROCAR BLADELESS OPT 5 100 (ENDOMECHANICALS) ×3 IMPLANT
TROCAR XCEL 12X100 BLDLESS (ENDOMECHANICALS) ×3 IMPLANT
TUBING CONNECTING 10 (TUBING) ×6 IMPLANT
TUBING ENDO SMARTCAP PENTAX (MISCELLANEOUS) ×3 IMPLANT
TUBING INSUF HEATED (TUBING) ×3 IMPLANT

## 2016-06-08 NOTE — Discharge Instructions (Signed)

## 2016-06-08 NOTE — Interval H&P Note (Signed)
History and Physical Interval Note:  06/08/2016 7:11 AM  Shelby Scott  has presented today for surgery, with the diagnosis of MORBID OBESITY  The various methods of treatment have been discussed with the patient and family. After consideration of risks, benefits and other options for treatment, the patient has consented to  Procedure(s): LAPAROSCOPIC ROUX-EN-Y GASTRIC BYPASS WITH UPPER ENDOSCOPY (N/A) as a surgical intervention .  The patient's history has been reviewed, patient examined, no change in status, stable for surgery.  I have reviewed the patient's chart and labs.  Questions were answered to the patient's satisfaction.     [name] B

## 2016-06-08 NOTE — Anesthesia Postprocedure Evaluation (Addendum)
Anesthesia Post Note  Patient: [name]  Procedure(s) Performed: Procedure(s) (LRB): LAPAROSCOPIC ROUX-EN-Y GASTRIC BYPASS WITH UPPER ENDOSCOPY (N/A) UPPER GI ENDOSCOPY  Patient location during evaluation: PACU Anesthesia Type: General Level of consciousness: awake and alert Pain management: pain level controlled Vital Signs Assessment: post-procedure vital signs reviewed and stable Respiratory status: spontaneous breathing, nonlabored ventilation, respiratory function stable and patient connected to nasal cannula oxygen Cardiovascular status: blood pressure returned to baseline and stable Postop Assessment: no signs of nausea or vomiting Anesthetic complications: no       Last Vitals:  Vitals:   06/08/16 1200 06/08/16 1215  BP: 121/71 128/63  Pulse: 84 78  Resp: 15 16  Temp: 36.8 C 36.7 C    Last Pain:  Vitals:   06/08/16 1220  TempSrc:   PainSc: Asleep                 Shelton Silvas

## 2016-06-08 NOTE — Op Note (Signed)
Preoperative diagnosis: Roux-en-Y gastric bypass  Postoperative diagnosis: Same   Procedure: Upper endoscopy   Surgeon: Chelsea A Connor, M.D.  Anesthesia: Gen.   Indications for procedure: This patient was undergoing a Roux-en-Y gastric bypass.   Description of procedure: The endoscopy was placed in the mouth and into the oropharynx and under endoscopic vision it was advanced to the esophagogastric junction. The pouch was insufflated and no bleeding or bubbles were seen. The GEJ was identified at 40cm from the teeth. No bleeding or leaks were detected. The scope was withdrawn without difficulty.   Chelsea A Connor, M.D. General, Bariatric, & Minimally Invasive Surgery [facility]    

## 2016-06-08 NOTE — Anesthesia Procedure Notes (Signed)
Procedure Name: Intubation Date/Time: 06/08/2016 7:30 AM Performed by: Deliah Boston Pre-anesthesia Checklist: Patient identified, Emergency Drugs available, Suction available and Patient being monitored Patient Re-evaluated:Patient Re-evaluated prior to inductionOxygen Delivery Method: Circle system utilized Preoxygenation: Pre-oxygenation with 100% oxygen Intubation Type: IV induction Ventilation: Mask ventilation without difficulty Laryngoscope Size: Mac and 3 Grade View: Grade I Tube type: Oral Tube size: 7.0 mm Number of attempts: 1 Airway Equipment and Method: Stylet and Oral airway Placement Confirmation: ETT inserted through vocal cords under direct vision,  positive ETCO2 and breath sounds checked- equal and bilateral Secured at: 18 cm Tube secured with: Tape Dental Injury: Teeth and Oropharynx as per pre-operative assessment

## 2016-06-08 NOTE — Progress Notes (Signed)
Inpatient Diabetes Program Recommendations  AACE/ADA: New Consensus Statement on Inpatient Glycemic Control (2015)  Target Ranges:  Prepandial:   less than 140 mg/dL      Peak postprandial:   less than 180 mg/dL (1-2 hours)      Critically ill patients:  140 - 180 mg/dL   Lab Results  Component Value Date   GLUCAP 197 (H) 06/08/2016   HGBA1C 7.5 (H) 06/04/2016    Review of Glycemic Control  Diabetes history: DM2 Outpatient Diabetes medications: Lantus 40 units bid, Novolog 10 units ac Lunch, metformin 500 mg bid, Victoza 1.2 mg QHS Current orders for Inpatient glycemic control: None  HgbA1C - 7.5%.  Inpatient Diabetes Program Recommendations:    Add Novolog 0-9 units Q4H Per endo, do not discharge on metformin or victoza. May need small amount of Lantus if CBGs consistently > 180 mg/dL. Check blood sugars when discharged at least 3x/day and take logbook to endo for review and any needed medication adjustments.  Discussed with RN. Will follow while inpatient.  Thank you. Ailene Ards, RD, LDN, CDE Inpatient Diabetes Coordinator [phone]

## 2016-06-08 NOTE — Op Note (Addendum)
Surgeon: Pollyann Savoy. Daphine Deutscher, MD, FACS Asst:  Phylliss Blakes, MD, FACS Anesthesia: General endotracheal Drains: None  Preop DX:  Morbid obesity with DM;  Weight 267.6    BMI 42.5 Postop dx:  same  Procedure: Laparoscopic Roux en Y gastric bypass with 40 cm BP limb and 100 cm Roux limb, antecolic, antegastric, candy cane to the left.  Closure of Peterson's defect. Upper endoscopy.   Description of Procedure:  The patient was taken to OR 1 at [facility] and given general anesthesia.  The abdomen was prepped with PCMX and draped sterilely.  A time out was performed.    The operation began by identifying the ligament of Treitz. I measured 45  cm downstream and divided the bowel with a 6 cm Covidian stapler.  I sutured a Penrose drain along the Roux limb end.  I measured a 1 meter (100 cm) Roux limb and then placed the distal bowels to the BP limb side by side and performed a stapled jejunojejunostomy. The common defect was closed from either end with 4-0 Vicryl using the Endo Stitch. The mesenteric defect was closed with a running 2-0 silk using the Endo Stitch. Tisseel was applied to the suture line.  The omentum was divided with the harmonic scalpel.  The Nathanson retractor was inserted in the left lateral segment of liver was retracted. The foregut dissection ensued.  No visible hiatal hernia was seen.  6 cm down on the lessor curvature I began the retrogastric dissection.  The stomach was stuck to the retroperitoneum more than usual.  The pouch was created with multiple firings of the Covidien purple load with the first two without TRS.  Separation of the pouch from the remnant noted  The Roux limb was then brought up with the candycane pointed left and a back row of sutures of 2-0 Vicryl were placed. I opened along the right side of each structure and inserted the 4.5 cm stapler to create the gastrojejunostomy. The common defect was closed from either end with 2-0 Vicryl and a second row was placed anterior  to that the Ewald tube acting as a stent across the anastomosis. The Penrose drain was removed. Peterson's defect was closed with 2-0 silk.   Endoscopy was performed by Dr. Fredricka Bonine and no bubbles were observed.  No bleeding was seen.    The incisions were injected with Exparel and were closed with 4-0 Monocryl and Dermabond  The patient was taken to the recovery room in satisfactory condition.  Matt B. Daphine Deutscher, MD, FACS

## 2016-06-08 NOTE — H&P (View-Only) (Signed)
Chief Complaint:  Morbid obesity and DM  History of Present Illness:  Shelby Scott is an 51 y.o. female who presented to [facility] office and was initially seen by Dr. Sheliah Hatch in May 2017.  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  She has in addition to DM she has peripheral neuropathy.  I have explained the procedure to her in detail and she is ready to proceed for gastric bypass  Past Medical History:  Diagnosis Date  . Anemia   . Anxiety   . Asthma   . Depression   . Diabetes mellitus without complication [facility])     Past Surgical History:  Procedure Laterality Date  . CHOLECYSTECTOMY      Current Outpatient Prescriptions  Medication Sig Dispense Refill  . ALPHA-LIPOIC ACID PO Take by mouth 2 (two) times daily.     . Biotin 1000 MCG tablet Take 1,000 mcg by mouth daily.     Marland Kitchen gabapentin (NEURONTIN) 300 MG capsule TAKE 1 CAPSULE(300 MG) BY MOUTH THREE TIMES DAILY 90 capsule 5  . glucose blood (ONE TOUCH ULTRA TEST) test strip 1 each by Other route 2 (two) times daily. And lancets 2/day 100 each 12  . insulin aspart (NOVOLOG FLEXPEN) 100 UNIT/ML FlexPen Inject 30 Units into the skin daily with supper. 15 mL 2  . Insulin Glargine (LANTUS SOLOSTAR) 100 UNIT/ML Solostar Pen Inject 90 Units into the skin every morning. 15 pen 11  . Liraglutide (VICTOZA) 18 MG/3ML SOPN Inject 0.3 mLs (1.8 mg total) into the skin daily. As discussed 18 mL 3  . losartan (COZAAR) 25 MG tablet Take 1 tablet (25 mg total) by mouth daily. 30 tablet 1  . metFORMIN (GLUCOPHAGE-XR) 500 MG 24 hr tablet Take 4 tablets (2,000 mg total) by mouth daily with breakfast. 360 tablet 3  . Multiple Vitamins-Minerals (MULTIVITAMIN WITH MINERALS) tablet Take 1 tablet by mouth daily.    . mupirocin ointment (BACTROBAN) 2 % Apply 1 application topically 3 (three) times daily. 22 g 1  . naproxen (NAPROSYN) 500 MG tablet Take 1 tablet (500 mg total) by mouth 2 (two) times daily. 30 tablet 0  . nitrofurantoin,  macrocrystal-monohydrate, (MACROBID) 100 MG capsule Take 1 capsule (100 mg total) by mouth 2 (two) times daily. 20 capsule 0  . polyethylene glycol powder (GLYCOLAX/MIRALAX) powder Mix 17 gms in 8 oz of H2O bid for 5-7 days (Patient taking differently: PRN) 289 g 0  . PROAIR HFA 108 (90 Base) MCG/ACT inhaler INHALE 2 PUFFS INTO THE LUNGS EVERY 6 HOURS AS NEEDED FOR WHEEZING OR SHORTNESS OF BREATH 8.5 g 0  . venlafaxine XR (EFFEXOR XR) 150 MG 24 hr capsule Take 1 capsule (150 mg total) by mouth daily with breakfast. 30 capsule 5   No current facility-administered medications for this visit.    Lisinopril Family History  Problem Relation Age of Onset  . Hypertension Father   . Diabetes Maternal Grandmother   . Mental illness Maternal Grandfather   . Diabetes Paternal Grandmother   . Hypertension Paternal Grandmother   . Heart disease Paternal Grandfather   . Diabetes Brother   . Mental illness Brother    Social History:   reports that she has never smoked. She has never used smokeless tobacco. She reports that she does not drink alcohol or use drugs.   REVIEW OF SYSTEMS : Negative except for see problem list  Physical Exam:   There were no vitals taken for this visit. There is no  height or weight on file to calculate BMI.  Gen:  WDWN WF NAD  Neurological: Alert and oriented to person, place, and time. Motor and sensory function is grossly intact  Head: Normocephalic and atraumatic.  Eyes: Conjunctivae are normal. Pupils are equal, round, and reactive to light. No scleral icterus.  Neck: Normal range of motion. Neck supple. No tracheal deviation or thyromegaly present.  Cardiovascular:  SR without murmurs or gallops.  No carotid bruits Breast:  Not examined Respiratory: Effort normal.  No respiratory distress. No chest wall tenderness. Breath sounds normal.  No wheezes, rales or rhonchi.  Abdomen:  nontender; scars from prior lap chole GU:  Not examined Musculoskeletal: Normal  range of motion. Extremities are nontender. No cyanosis, edema or clubbing noted Lymphadenopathy: No cervical, preauricular, postauricular or axillary adenopathy is present Skin: Skin is warm and dry. No rash noted. No diaphoresis. No erythema. No pallor. Pscyh: Normal mood and affect. Behavior is normal. Judgment and thought content normal.   LABORATORY RESULTS: No results found for this or any previous visit (from the past 48 hour(s)).   RADIOLOGY RESULTS: No results found.  Problem List: Patient Active Problem List   Diagnosis Date Noted  . Essential hypertension 05/06/2016  . Type 2 diabetes mellitus with diabetic neuropathy (HCC) 10/08/2014    Assessment & Plan: Type II DM and obesity-for gastric bypass    Matt B. Daphine Deutscher, MD, [facility] [phone] beeper [phone]  05/12/2016 5:46 PM

## 2016-06-08 NOTE — Transfer of Care (Signed)
Immediate Anesthesia Transfer of Care Note  Patient: Casilda Espinal  Procedure(s) Performed: Procedure(s): LAPAROSCOPIC ROUX-EN-Y GASTRIC BYPASS WITH UPPER ENDOSCOPY (N/A) UPPER GI ENDOSCOPY  Patient Location: PACU  Anesthesia Type:General  Level of Consciousness: awake, alert  and patient cooperative  Airway & Oxygen Therapy: Patient Spontanous Breathing and Patient connected to face mask oxygen  Post-op Assessment: Report given to RN and Post -op Vital signs reviewed and stable  Post vital signs: Reviewed and stable  Last Vitals:  Vitals:   06/08/16 0533  BP: (!) 145/86  Pulse: 91  Resp: 16  Temp: 36.9 C    Last Pain:  Vitals:   06/08/16 0533  TempSrc: Oral      Patients Stated Pain Goal: 3 (06/08/16 0551)  Complications: No apparent anesthesia complications

## 2016-06-09 ENCOUNTER — Encounter (HOSPITAL_COMMUNITY): Payer: Self-pay | Admitting: Surgery

## 2016-06-09 ENCOUNTER — Telehealth: Payer: Self-pay | Admitting: Endocrinology

## 2016-06-09 LAB — GLUCOSE, CAPILLARY
GLUCOSE-CAPILLARY: 204 mg/dL — AB (ref 65–99)
Glucose-Capillary: 162 mg/dL — ABNORMAL HIGH (ref 65–99)

## 2016-06-09 LAB — CBC WITH DIFFERENTIAL/PLATELET
Basophils Absolute: 0 10*3/uL (ref 0.0–0.1)
Basophils Relative: 0 %
EOS ABS: 0 10*3/uL (ref 0.0–0.7)
EOS PCT: 0 %
HCT: 35.5 % — ABNORMAL LOW (ref 36.0–46.0)
Hemoglobin: 11.6 g/dL — ABNORMAL LOW (ref 12.0–15.0)
LYMPHS ABS: 1.8 10*3/uL (ref 0.7–4.0)
LYMPHS PCT: 13 %
MCH: 29.3 pg (ref 26.0–34.0)
MCHC: 32.7 g/dL (ref 30.0–36.0)
MCV: 89.6 fL (ref 78.0–100.0)
MONOS PCT: 5 %
Monocytes Absolute: 0.8 10*3/uL (ref 0.1–1.0)
Neutro Abs: 11.3 10*3/uL — ABNORMAL HIGH (ref 1.7–7.7)
Neutrophils Relative %: 82 %
PLATELETS: 266 10*3/uL (ref 150–400)
RBC: 3.96 MIL/uL (ref 3.87–5.11)
RDW: 14 % (ref 11.5–15.5)
WBC: 13.8 10*3/uL — AB (ref 4.0–10.5)

## 2016-06-09 MED ORDER — INSULIN ASPART 100 UNIT/ML ~~LOC~~ SOLN
0.0000 [IU] | SUBCUTANEOUS | Status: DC
  Administered 2016-06-09: 5 [IU] via SUBCUTANEOUS
  Administered 2016-06-09: 3 [IU] via SUBCUTANEOUS

## 2016-06-09 NOTE — Progress Notes (Signed)
Inpatient Diabetes Program Recommendations  AACE/ADA: New Consensus Statement on Inpatient Glycemic Control (2015)  Target Ranges:  Prepandial:   less than 140 mg/dL      Peak postprandial:   less than 180 mg/dL (1-2 hours)      Critically ill patients:  140 - 180 mg/dL   Lab Results  Component Value Date   GLUCAP 204 (H) 06/09/2016   HGBA1C 7.5 (H) 06/04/2016    Review of Glycemic Control  FBS - FBS > 180 mg/dL Needs small amount of Lantus + Novolog correction  Inpatient Diabetes Program Recommendations:    Add Lantus 10 units QHS Add Novolog 0-9 units Q4H.  Will follow while inpatient.  Thank you. Ailene Ards, RD, LDN, CDE Inpatient Diabetes Coordinator [phone]

## 2016-06-09 NOTE — Progress Notes (Signed)
Patient alert and oriented, Post op day 1.  Provided support and encouragement.  Encouraged pulmonary toilet, ambulation and small sips of liquids.  All questions answered.  Will continue to monitor. 

## 2016-06-09 NOTE — Telephone Encounter (Signed)
Patient notified of instructions via voicemail. Requested a call back if the patient would like to discuss further. 

## 2016-06-09 NOTE — Discharge Summary (Signed)
Physician Discharge Summary  Patient ID: Shelby Scott MRN: [id number] DOB/AGE: 52-May-1967 52 y.o.  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  Was taking sufficient liquids and wants to go home on PD 1  Consults: none  Significant Diagnostic Studies: none    Discharge Exam: Blood pressure (!) 144/58, pulse 65, temperature 98.4 F (36.9 C), temperature source Oral, resp. rate 18, height 5' 6.5" (1.689 m), weight 122.2 kg (269 lb 6.4 oz), SpO2 99 %. Incisions OK  Disposition: 01-Home or Self Care  Discharge Instructions    Ambulate hourly while awake    Complete by:  As directed    Call MD for:  difficulty breathing, headache or visual disturbances    Complete by:  As directed    Call MD for:  persistant dizziness or light-headedness    Complete by:  As directed    Call MD for:  persistant nausea and vomiting    Complete by:  As directed    Call MD for:  redness, tenderness, or signs of infection (pain, swelling, redness, odor or green/yellow discharge around incision site)    Complete by:  As directed    Call MD for:  severe uncontrolled pain    Complete by:  As directed    Call MD for:  temperature >101 F    Complete by:  As directed    Diet bariatric full liquid    Complete by:  As directed    Incentive spirometry    Complete by:  As directed    Perform hourly while awake     Allergies as of 06/09/2016      Reactions   Lisinopril Cough      Medication List    STOP taking these medications   ibuprofen 200 MG tablet Commonly known as:  ADVIL,MOTRIN   naproxen sodium 220 MG tablet Commonly known as:  ANAPROX     TAKE  these medications   ACETYLCARN-ALPHA LIPOIC ACID PO Take 1 tablet by mouth 2 (two) times daily.   B-complex with vitamin C tablet Take 1 tablet by mouth daily.   Biotin 1000 MCG tablet Take 1,000 mcg by mouth 2 (two) times daily.   EPIDUO EX Apply 1 application topically at bedtime.   fluticasone 50 MCG/ACT nasal spray Commonly known as:  FLONASE Place 2 sprays into both nostrils daily.   gabapentin 300 MG capsule Commonly known as:  NEURONTIN TAKE 1 CAPSULE(300 MG) BY MOUTH THREE TIMES DAILY What changed:  how much to take  how to take this  when to take this  additional instructions   glucose blood test strip Commonly known as:  ONE TOUCH ULTRA TEST 1 each by Other route 2 (two) times daily. And lancets 2/day What changed:  when to take this  additional instructions   insulin aspart 100 UNIT/ML FlexPen Commonly known as:  NOVOLOG FLEXPEN Inject 30 Units into the skin daily with supper. What changed:  how much to take  when to take this   Insulin Glargine 100 UNIT/ML Solostar Pen Commonly known as:  LANTUS SOLOSTAR Inject 90 Units into the skin every morning. What changed:  how  much to take  when to take this   liraglutide 18 MG/3ML Sopn Commonly known as:  VICTOZA Inject 0.3 mLs (1.8 mg total) into the skin daily. As discussed What changed:  how much to take  when to take this  additional instructions   losartan 25 MG tablet Commonly known as:  COZAAR Take 1 tablet (25 mg total) by mouth daily. Notes to patient:  Monitor Blood Pressure Daily and keep a log for primary care physician.  You may need to make changes to your medications with rapid weight loss.     metFORMIN 500 MG 24 hr tablet Commonly known as:  GLUCOPHAGE-XR Take 4 tablets (2,000 mg total) by mouth daily with breakfast. What changed:  how much to take  when to take this   multivitamin with minerals Tabs tablet Take 1 tablet by mouth at bedtime.   mupirocin ointment  2 % Commonly known as:  BACTROBAN Apply 1 application topically 3 (three) times daily.   ondansetron 4 MG disintegrating tablet Commonly known as:  ZOFRAN-ODT Take 4 mg by mouth every 6 (six) hours as needed for nausea.   pantoprazole 40 MG tablet Commonly known as:  PROTONIX Take 40 mg by mouth daily.   polyethylene glycol powder powder Commonly known as:  GLYCOLAX/MIRALAX Mix 17 gms in 8 oz of H2O bid for 5-7 days What changed:  how much to take  how to take this  when to take this  reasons to take this  additional instructions   PROAIR HFA 108 (90 Base) MCG/ACT inhaler Generic drug:  albuterol INHALE 2 PUFFS INTO THE LUNGS EVERY 6 HOURS AS NEEDED FOR WHEEZING OR SHORTNESS OF BREATH   SYSTANE 0.4-0.3 % Gel ophthalmic gel Generic drug:  Polyethyl Glycol-Propyl Glycol Place 1 application into both eyes 2 (two) times daily as needed (for dry/irritated eyes).   venlafaxine 75 MG tablet Commonly known as:  EFFEXOR Take 1 tablet (75 mg total) by mouth 2 (two) times daily.   Vitamin D-3 5000 units Tabs Take 5,000 Units by mouth at bedtime.      Follow-up Information    Valarie Merino, MD. Go on 06/24/2016.   Specialty:  General Surgery Why:  at 10am Contact information: [address] [place] [address] [phone]        Valarie Merino, MD Follow up.   Specialty:  General Surgery Contact information: [address] [place] [address] [phone]           Signed: Valarie Merino 06/09/2016, 2:03 PM

## 2016-06-09 NOTE — Progress Notes (Signed)
Inpatient Diabetes Program Recommendations  AACE/ADA: New Consensus Statement on Inpatient Glycemic Control (2015)  Target Ranges:  Prepandial:   less than 140 mg/dL      Peak postprandial:   less than 180 mg/dL (1-2 hours)      Critically ill patients:  140 - 180 mg/dL   Lab Results  Component Value Date   GLUCAP 162 (H) 06/09/2016   HGBA1C 7.5 (H) 06/04/2016    Review of Glycemic Control  Pt called endo - Dr. Everardo All to inquire about how much insulin pt should take at home. Being discharged today. Pt states "looks like you just need a small amount of insulin. Continue the insulin they are prescribing for you."  Pt is on Novolog 0-15 units Q4H. Given correction scale for discharge. Will not continue Lantus, metformin, and Victoza.  Advised pt to check blood sugars at least QID and take logbook to endo at f/u appt. Instructed on hypoglycemia s/s and treatment. Used teach-back.  Discussed above with Bariatric RN.  Thank you. Ailene Ards, RD, LDN, CDE Inpatient Diabetes Coordinator [phone]

## 2016-06-09 NOTE — Progress Notes (Signed)
Patient alert and oriented, pain is controlled. Patient is tolerating fluids, advanced to protein shake today, patient is tolerating well. Reviewed Gastric Bypass discharge instructions with patient and patient is able to articulate understanding. Provided information on BELT program, Support Group and [facility] outpatient pharmacy. All questions answered, will continue to monitor.    

## 2016-06-09 NOTE — Progress Notes (Signed)
Pt's vitals are WNL, tolerating diet and pain is under control. Discussed discharge instructions and discharged to home.

## 2016-06-09 NOTE — Telephone Encounter (Signed)
Looks like you just need a small amount of insulin--good.  Please continue the insulin they are prescribing for you.

## 2016-06-09 NOTE — Telephone Encounter (Signed)
Pt is in the hospital and just had surgery, and she said she feels that her insulin needs to be adjusted, this morning her sugar was 204 she was given 5 units and then went down to 162, then they gave her 3 more units. The hospital is concerned with the amount of insulin she is actually supposed to be taking versus what she is being given now.  They would like to know what Dr. Everardo All wants to do.  Can call back on cell.

## 2016-06-09 NOTE — Telephone Encounter (Signed)
See message and please advise, Thanks!  

## 2016-06-09 NOTE — Plan of Care (Signed)
Problem: Food- and Nutrition-Related Knowledge Deficit (NB-1.1) Goal: Nutrition education Formal process to instruct or train a patient/client in a skill or to impart knowledge to help patients/clients voluntarily manage or modify food choices and eating behavior to maintain or improve health. Outcome: Completed/Met Date Met: 06/09/16 Nutrition Education Note  Received consult for diet education per DROP protocol.   Discussed 2 week post op diet with pt. Emphasized that liquids must be non carbonated, non caffeinated, and sugar free. Fluid goals discussed. Pt to follow up with outpatient bariatric RD for further diet progression after 2 weeks. Multivitamins and minerals also reviewed. Teach back method used, pt expressed understanding, expect good compliance.   Diet: First 2 Weeks  You will see the nutritionist about two (2) weeks after your surgery. The nutritionist will increase the types of foods you can eat if you are handling liquids well:  If you have severe vomiting or nausea and cannot handle clear liquids lasting longer than 1 day, call your surgeon  Protein Shake  Drink at least 2 ounces of shake 5-6 times per day  Each serving of protein shakes (usually 8 - 12 ounces) should have a minimum of:  15 grams of protein  And no more than 5 grams of carbohydrate  Goal for protein each day:  Men = 80 grams per day  Women = 60 grams per day  Protein powder may be added to fluids such as non-fat milk or Lactaid milk or Soy milk (limit to 35 grams added protein powder per serving)   Hydration  Slowly increase the amount of water and other clear liquids as tolerated (See Acceptable Fluids)  Slowly increase the amount of protein shake as tolerated  Sip fluids slowly and throughout the day  May use sugar substitutes in small amounts (no more than 6 - 8 packets per day; i.e. Splenda)   Fluid Goal  The first goal is to drink at least 8 ounces of protein shake/drink per day (or as directed  by the nutritionist); some examples of protein shakes are Premier Protein, [employer], [employer], EAS Edge HP, and Unjury. See handout from pre-op Bariatric Education Class:  Slowly increase the amount of protein shake you drink as tolerated  You may find it easier to slowly sip shakes throughout the day  It is important to get your proteins in first  Your fluid goal is to drink 64 - 100 ounces of fluid daily  It may take a few weeks to build up to this  32 oz (or more) should be clear liquids  And  32 oz (or more) should be full liquids (see below for examples)  Liquids should not contain sugar, caffeine, or carbonation   Clear Liquids:  Water or Sugar-free flavored water (i.e. Fruit H2O, Propel)  Decaffeinated coffee or tea (sugar-free)  Crystal Lite, Wyler's Lite, Minute Maid Lite  Sugar-free Jell-O  Bouillon or broth  Sugar-free Popsicle: *Less than 20 calories each; Limit 1 per day   Full Liquids:  Protein Shakes/Drinks + 2 choices per day of other full liquids  Full liquids must be:  No More Than 12 grams of Carbs per serving  No More Than 3 grams of Fat per serving  Strained low-fat cream soup  Non-Fat milk  Fat-free Lactaid Milk  Sugar-free yogurt (Dannon Lite & Fit, [place] yogurt, Oikos Zero)   Clayton Bibles, MS, RD, LDN Pager: [phone] After Hours Pager: [phone]

## 2016-06-15 ENCOUNTER — Ambulatory Visit (INDEPENDENT_AMBULATORY_CARE_PROVIDER_SITE_OTHER): Payer: BLUE CROSS/BLUE SHIELD | Admitting: Emergency Medicine

## 2016-06-15 VITALS — BP 123/80 | HR 89 | Temp 98.6°F | Resp 17 | Ht 66.5 in | Wt 260.0 lb

## 2016-06-15 DIAGNOSIS — T7840XA Allergy, unspecified, initial encounter: Secondary | ICD-10-CM | POA: Insufficient documentation

## 2016-06-15 MED ORDER — PREDNISONE 20 MG PO TABS: 40.0000 mg | ORAL_TABLET | Freq: Every day | ORAL | 0 refills | Status: AC

## 2016-06-15 NOTE — Patient Instructions (Addendum)
     IF you received an x-ray today, you will receive an invoice from [facility]. Please contact [facility] at [phone] with questions or concerns regarding your invoice.   IF you received labwork today, you will receive an invoice from [facility]. Please contact [facility] at [phone] with questions or concerns regarding your invoice.   Our billing staff will not be able to assist you with questions regarding bills from these companies.  You will be contacted with the lab results as soon as they are available. The fastest way to get your results is to activate your My Chart account. Instructions are located on the last page of this paperwork. If you have not heard from [place] regarding the results in 2 weeks, please contact this office.    We recommend that you schedule a mammogram for breast cancer screening. Typically, you do not need a referral to do this. Please contact a local imaging center to schedule your mammogram.  [facility] - [phone]  *ask for the Radiology Department [facility]) - [phone] or [phone]  [facility] - [phone] [facility] - [phone] [facility] - [phone]  *ask for the Radiology Department [facility] - [phone]  *ask for the Radiology Department [facility] - [phone]  *ask for the Mammography Department [facility] - [phone] Allergies An allergy is when your body reacts to a substance in a way that is not normal. An allergic reaction can happen after you:  Eat something.  Breathe in something.  Touch something. You can be allergic to:  Things that are only around during certain seasons, like molds and pollens.  Foods.  Drugs.  Insects.  Animal dander. What are the signs or symptoms?  Puffiness (swelling). This may happen on the lips, face, tongue, mouth, or  throat.  Sneezing.  Coughing.  Breathing loudly (wheezing).  Stuffy nose.  Tingling in the mouth.  A rash.  Itching.  Itchy, red, puffy areas of skin (hives).  Watery eyes.  Throwing up (vomiting).  Watery poop (diarrhea).  Dizziness.  Feeling faint or fainting.  Trouble breathing or swallowing.  A tight feeling in the chest.  A fast heartbeat. How is this diagnosed? Allergies can be diagnosed with:  A medical and family history.  Skin tests.  Blood tests.  A food diary. A food diary is a record of all the foods, drinks, and symptoms you have each day.  The results of an elimination diet. This diet involves making sure not to eat certain foods and then seeing what happens when you start eating them again. How is this treated? There is no cure for allergies, but allergic reactions can be treated with medicine. Severe reactions usually need to be treated at a hospital. How is this prevented? The best way to prevent an allergic reaction is to avoid the thing you are allergic to. Allergy shots and medicines can also help prevent reactions in some cases. This information is not intended to replace advice given to you by your health care provider. Make sure you discuss any questions you have with your health care provider. Document Released: 06/06/2012 Document Revised: 10/07/2015 Document Reviewed: 11/21/2013 Elsevier Interactive Patient Education  2017 [employer].

## 2016-06-15 NOTE — Progress Notes (Signed)
Shelby Scott 51 y.o.   Chief Complaint  Patient presents with  . Itching all over    After eating unjury soup for the 3rd time/ 1st time in hospital after gastric by pass  . Edema    throat and tongue    HISTORY OF PRESENT ILLNESS: This is a 51 y.o. female complaining of bad allergic reaction that started last night; paramedics at the scene gave her chewable Benadryl with good fast results; initially had  respiratory symptoms with facial swelling and later developed a rash. HPI   Prior to Admission medications   Medication Sig Start Date End Date Taking? Authorizing Provider  ACETYLCARN-ALPHA LIPOIC ACID PO Take 1 tablet by mouth 2 (two) times daily.   Yes Historical Provider, MD  Adapalene-Benzoyl Peroxide (EPIDUO EX) Apply 1 application topically at bedtime.   Yes Historical Provider, MD  B Complex-C (B-COMPLEX WITH VITAMIN C) tablet Take 1 tablet by mouth daily.   Yes Historical Provider, MD  Biotin 1000 MCG tablet Take 1,000 mcg by mouth 2 (two) times daily.    Yes Historical Provider, MD  Cholecalciferol (VITAMIN D-3) 5000 units TABS Take 5,000 Units by mouth at bedtime.   Yes Historical Provider, MD  fluticasone (FLONASE) 50 MCG/ACT nasal spray Place 2 sprays into both nostrils daily. 06/06/16  Yes Peyton Najjar, MD  gabapentin (NEURONTIN) 300 MG capsule TAKE 1 CAPSULE(300 MG) BY MOUTH THREE TIMES DAILY Patient taking differently: Take 300 mg by mouth 2 (two) times daily.  12/26/15  Yes Stephanie D English, PA  glucose blood (ONE TOUCH ULTRA TEST) test strip 1 each by Other route 2 (two) times daily. And lancets 2/day Patient taking differently: 1 each by Other route daily.  07/26/15  Yes Romero Belling, MD  insulin aspart (NOVOLOG FLEXPEN) 100 UNIT/ML FlexPen Inject 30 Units into the skin daily with supper. Patient taking differently: Inject 10 Units into the skin daily before lunch.  04/01/16  Yes Romero Belling, MD  Insulin Glargine (LANTUS SOLOSTAR) 100 UNIT/ML Solostar Pen Inject 90  Units into the skin every morning. Patient taking differently: Inject 40 Units into the skin 2 (two) times daily.  04/01/16  Yes Romero Belling, MD  losartan (COZAAR) 25 MG tablet Take 1 tablet (25 mg total) by mouth daily. 05/06/16  Yes Elizabeth Whitney McVey, PA-C  Multiple Vitamin (MULTIVITAMIN WITH MINERALS) TABS tablet Take 1 tablet by mouth at bedtime.   Yes Historical Provider, MD  mupirocin ointment (BACTROBAN) 2 % Apply 1 application topically 3 (three) times daily. 12/26/15  Yes Stephanie D English, PA  ondansetron (ZOFRAN-ODT) 4 MG disintegrating tablet Take 4 mg by mouth every 6 (six) hours as needed for nausea. 05/12/16  Yes Historical Provider, MD  pantoprazole (PROTONIX) 40 MG tablet Take 40 mg by mouth daily. 05/12/16  Yes Historical Provider, MD  Polyethyl Glycol-Propyl Glycol (SYSTANE) 0.4-0.3 % GEL ophthalmic gel Place 1 application into both eyes 2 (two) times daily as needed (for dry/irritated eyes).   Yes Historical Provider, MD  polyethylene glycol powder (GLYCOLAX/MIRALAX) powder Mix 17 gms in 8 oz of H2O bid for 5-7 days Patient taking differently: Take 17 g by mouth daily as needed (for constipation.).  02/26/15  Yes Stephanie D English, PA  PROAIR HFA 108 [phone] Base) MCG/ACT inhaler INHALE 2 PUFFS INTO THE LUNGS EVERY 6 HOURS AS NEEDED FOR WHEEZING OR SHORTNESS OF BREATH 11/20/15  Yes Collie Siad English, PA  venlafaxine (EFFEXOR) 75 MG tablet Take 1 tablet (75 mg total) by mouth 2 (  two) times daily. 06/06/16  Yes Peyton Najjar, MD  Liraglutide (VICTOZA) 18 MG/3ML SOPN Inject 0.3 mLs (1.8 mg total) into the skin daily. As discussed Patient not taking: Reported on 06/15/2016 07/26/15   Romero Belling, MD  metFORMIN (GLUCOPHAGE-XR) 500 MG 24 hr tablet Take 4 tablets (2,000 mg total) by mouth daily with breakfast. Patient not taking: Reported on 06/15/2016 07/26/15   Romero Belling, MD    Allergies  Allergen Reactions  . Lisinopril Cough    Patient Active Problem List   Diagnosis Date Noted   . 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 S/P gastric bypass 06/08/2016  . Essential hypertension 05/06/2016  . Type 2 diabetes mellitus with diabetic neuropathy (HCC) 10/08/2014    Past Medical History:  Diagnosis Date  . Anemia   . Anxiety   . Asthma   . Depression   . Diabetes mellitus without complication (HCC)   . Hypertension     Past Surgical History:  Procedure Laterality Date  . CHOLECYSTECTOMY    . GASTRIC ROUX-EN-Y N/A 06/08/2016   Procedure: LAPAROSCOPIC ROUX-EN-Y GASTRIC BYPASS WITH UPPER ENDOSCOPY;  Surgeon: Luretha Murphy, MD;  Location: [facility];  Service: General;  Laterality: N/A;  . UPPER GI ENDOSCOPY  06/08/2016   Procedure: UPPER GI ENDOSCOPY;  Surgeon: Luretha Murphy, MD;  Location: [facility];  Service: General;;    Social History   Social History  . Marital status: Divorced    Spouse name: N/A  . Number of children: N/A  . Years of education: N/A   Occupational History  . Not on file.   Social History Main Topics  . Smoking status: Never Smoker  . Smokeless tobacco: Never Used  . Alcohol use No  . Drug use: No  . Sexual activity: No   Other Topics Concern  . Not on file   Social History Narrative  . No narrative on file    Family History  Problem Relation Age of Onset  . Hypertension Father   . Diabetes Maternal Grandmother   . Mental illness Maternal Grandfather   . Diabetes Paternal Grandmother   . Hypertension Paternal Grandmother   . Heart disease Paternal Grandfather   . Diabetes Brother   . Mental illness Brother      Review of Systems  Constitutional: Negative.  Negative for chills, fever and malaise/fatigue.  HENT: Negative.  Negative for congestion, nosebleeds and sore throat.   Eyes: Negative.  Negative for double vision, discharge and redness.  Respiratory: Negative for cough, shortness of breath, wheezing and stridor.   Cardiovascular: Negative for chest pain, palpitations and leg swelling.    Gastrointestinal: Negative for abdominal pain, diarrhea, nausea and vomiting.  Genitourinary: Negative for dysuria and hematuria.  Musculoskeletal: Negative.   Skin: Positive for rash (improved).  Neurological: Negative for dizziness and headaches.  Endo/Heme/Allergies: Negative.   All other systems reviewed and are negative.  Vitals:   06/15/16 1116  BP: 123/80  Pulse: 89  Resp: 17  Temp: 98.6 F (37 C)     Physical Exam  Constitutional: She is oriented to person, place, and time. She appears well-developed and well-nourished.  HENT:  Head: Normocephalic and atraumatic.  Nose: Nose normal.  Mouth/Throat: Oropharynx is clear and moist.  Eyes: Conjunctivae and EOM are normal. Pupils are equal, round, and reactive to light.  Neck: Normal range of motion. Neck supple. No JVD present. No thyromegaly present.  Cardiovascular: Normal rate, regular rhythm and normal heart sounds.  Pulmonary/Chest: Effort normal and breath sounds normal.  Abdominal: Soft. Bowel sounds are normal. She exhibits no distension. There is no tenderness.  Musculoskeletal: Normal range of motion.  Lymphadenopathy:    She has no cervical adenopathy.  Neurological: She is alert and oriented to person, place, and time. No sensory deficit. She exhibits normal muscle tone.  Skin: Skin is warm and dry. Capillary refill takes less than 2 seconds. No rash noted.  Psychiatric: She has a normal mood and affect. Her behavior is normal.  Vitals reviewed.    ASSESSMENT & PLAN: Annis was seen today for itching all over and edema.  Diagnoses and all orders for this visit:  Acute allergic reaction, initial encounter  Other orders -     predniSONE (DELTASONE) 20 MG tablet; Take 2 tablets (40 mg total) by mouth daily with breakfast.    Patient Instructions       IF you received an x-ray today, you will receive an invoice from [facility]. Please contact [facility] at [phone] with  questions or concerns regarding your invoice.   IF you received labwork today, you will receive an invoice from [facility]. Please contact [facility] at [phone] with questions or concerns regarding your invoice.   Our billing staff will not be able to assist you with questions regarding bills from these companies.  You will be contacted with the lab results as soon as they are available. The fastest way to get your results is to activate your My Chart account. Instructions are located on the last page of this paperwork. If you have not heard from [place] regarding the results in 2 weeks, please contact this office.    We recommend that you schedule a mammogram for breast cancer screening. Typically, you do not need a referral to do this. Please contact a local imaging center to schedule your mammogram.  [facility] - [phone]  *ask for the Radiology Department [facility]) - [phone] or [phone]  [facility] - [phone] [facility] - [phone] [facility] - [phone]  *ask for the Radiology Department [facility] - [phone]  *ask for the Radiology Department [facility] - [phone]  *ask for the Mammography Department [facility] - [phone] Allergies An allergy is when your body reacts to a substance in a way that is not normal. An allergic reaction can happen after you:  Eat something.  Breathe in something.  Touch something. You can be allergic to:  Things that are only around during certain seasons, like molds and pollens.  Foods.  Drugs.  Insects.  Animal dander. What are the signs or symptoms?  Puffiness (swelling). This may happen on the lips, face, tongue, mouth, or throat.  Sneezing.  Coughing.  Breathing loudly (wheezing).  Stuffy nose.  Tingling in the mouth.  A rash.  Itching.  Itchy, red, puffy areas of skin  (hives).  Watery eyes.  Throwing up (vomiting).  Watery poop (diarrhea).  Dizziness.  Feeling faint or fainting.  Trouble breathing or swallowing.  A tight feeling in the chest.  A fast heartbeat. How is this diagnosed? Allergies can be diagnosed with:  A medical and family history.  Skin tests.  Blood tests.  A food diary. A food diary is a record of all the foods, drinks, and symptoms you have each day.  The results of an elimination diet. This diet involves making sure not to eat certain  foods and then seeing what happens when you start eating them again. How is this treated? There is no cure for allergies, but allergic reactions can be treated with medicine. Severe reactions usually need to be treated at a hospital. How is this prevented? The best way to prevent an allergic reaction is to avoid the thing you are allergic to. Allergy shots and medicines can also help prevent reactions in some cases. This information is not intended to replace advice given to you by your health care provider. Make sure you discuss any questions you have with your health care provider. Document Released: 06/06/2012 Document Revised: 10/07/2015 Document Reviewed: 11/21/2013 Elsevier Interactive Patient Education  2017 Elsevier Inc.      Edwina Barth, MD [facility]

## 2016-06-18 ENCOUNTER — Telehealth (HOSPITAL_COMMUNITY): Payer: Self-pay

## 2016-06-18 NOTE — Telephone Encounter (Signed)
Made discharge phone call to patient . Asking the following questions.    1. Do you have someone to care for you now that you are home? independent 2. Are you having pain now that is not relieved by your pain medication?  Doesn't need pain med 3. Are you able to drink the recommended daily amount of fluids (48 ounces minimum/day) and protein (60-80 grams/day) as prescribed by the dietitian or nutritional counselor?  64 ounces of fluid and 60 grams of protein 4. Are you taking the vitamins and minerals as prescribed?  yes 5. Do you have the "on call" number to contact your surgeon if you have a problem or question?  yes 6. Are your incisions free of redness, swelling or drainage? (If steri strips, address that these can fall off, shower as tolerated) yes 7. Have your bowels moved since your surgery?  If not, are you passing gas?  Yes had to use MOM 8. Are you up and walking 3-4 times per day?   9. Were you provided your discharge medications before your surgery or before you were discharged from the hospital and are you taking them without problem?  yes

## 2016-06-23 ENCOUNTER — Encounter: Payer: Self-pay | Admitting: Endocrinology

## 2016-06-23 ENCOUNTER — Encounter: Payer: Self-pay | Admitting: Skilled Nursing Facility1

## 2016-06-23 ENCOUNTER — Ambulatory Visit (INDEPENDENT_AMBULATORY_CARE_PROVIDER_SITE_OTHER): Payer: BLUE CROSS/BLUE SHIELD | Admitting: Endocrinology

## 2016-06-23 ENCOUNTER — Encounter: Payer: BLUE CROSS/BLUE SHIELD | Attending: Surgery | Admitting: Skilled Nursing Facility1

## 2016-06-23 VITALS — BP 124/78 | HR 74 | Ht 66.0 in | Wt 258.0 lb

## 2016-06-23 DIAGNOSIS — Z6841 Body Mass Index (BMI) 40.0 and over, adult: Secondary | ICD-10-CM | POA: Insufficient documentation

## 2016-06-23 DIAGNOSIS — E1140 Type 2 diabetes mellitus with diabetic neuropathy, unspecified: Secondary | ICD-10-CM

## 2016-06-23 DIAGNOSIS — Z713 Dietary counseling and surveillance: Secondary | ICD-10-CM | POA: Insufficient documentation

## 2016-06-23 DIAGNOSIS — F329 Major depressive disorder, single episode, unspecified: Secondary | ICD-10-CM | POA: Diagnosis not present

## 2016-06-23 DIAGNOSIS — Z794 Long term (current) use of insulin: Secondary | ICD-10-CM

## 2016-06-23 MED ORDER — METFORMIN HCL 500 MG PO TABS: 1000.0000 mg | ORAL_TABLET | Freq: Two times a day (BID) | ORAL | 3 refills | Status: DC

## 2016-06-23 NOTE — Patient Instructions (Addendum)
I have sent a prescription to your pharmacy, to add the non-extended-release metformin.  Please start at 1 pill per day, then increase every few days, up to 2 pills, twice a day.   Please reduce the insulin as you can. Call if you have any question about this.   To further reduce the insulin need, we can resume the victoza, or add "acarbose."   Please call us next week, to tell us how the blood sugar is doing.    Our goal is to go off the insulin.  check your blood sugar twice a day.  vary the time of day when you check, between before the 3 meals, and at bedtime.  also check if you have symptoms of your blood sugar being too high or too low.  please keep a record of the readings and bring it to your next appointment here (or you can bring the meter itself).  You can write it on any piece of paper.  please call us sooner if your blood sugar goes below 70, or if you have a lot of readings over 200.   Please come back for a follow-up appointment in 1 month.

## 2016-06-23 NOTE — Progress Notes (Signed)
Subjective:    Patient ID: Shelby Scott, female    DOB: Sep 14, 1965, 51 y.o.   MRN: [id number]  HPI Pt returns for f/u of diabetes mellitus: DM type: Insulin-requiring type 2 Dx'ed: 2005 Complications: polyneuropathy Therapy: insulin since 2013, metformin, and victoza GDM: never DKA: never Severe hypoglycemia: never. Pancreatitis: never.  Other: she takes 2 insulins both QD, after poor results with multiple daily injections.  Interval history: She is due to advance to pureed diet today.  She has lost 9 lbs so far.  She takes lantus, 25 units bid.  She seldom takes novolog.  cbg's are in the low-100's.   Past Medical History:  Diagnosis Date  . Anemia   . Anxiety   . Asthma   . Depression   . Diabetes mellitus without complication (HCC)   . Hypertension     Past Surgical History:  Procedure Laterality Date  . CHOLECYSTECTOMY    . GASTRIC ROUX-EN-Y N/A 06/08/2016   Procedure: LAPAROSCOPIC ROUX-EN-Y GASTRIC BYPASS WITH UPPER ENDOSCOPY;  Surgeon: Luretha Murphy, MD;  Location: [facility];  Service: General;  Laterality: N/A;  . UPPER GI ENDOSCOPY  06/08/2016   Procedure: UPPER GI ENDOSCOPY;  Surgeon: Luretha Murphy, MD;  Location: [facility];  Service: General;;    Social History   Social History  . Marital status: Divorced    Spouse name: N/A  . Number of children: N/A  . Years of education: N/A   Occupational History  . Not on file.   Social History Main Topics  . Smoking status: Never Smoker  . Smokeless tobacco: Never Used  . Alcohol use No  . Drug use: No  . Sexual activity: No   Other Topics Concern  . Not on file   Social History Narrative  . No narrative on file    Current Outpatient Prescriptions on File Prior to Visit  Medication Sig Dispense Refill  . ACETYLCARN-ALPHA LIPOIC ACID PO Take 1 tablet by mouth 2 (two) times daily.    . Adapalene-Benzoyl Peroxide (EPIDUO EX) Apply 1 application topically at bedtime.    . B Complex-C (B-COMPLEX WITH VITAMIN C)  tablet Take 1 tablet by mouth daily.    . Biotin 1000 MCG tablet Take 1,000 mcg by mouth 2 (two) times daily.     . Cholecalciferol (VITAMIN D-3) 5000 units TABS Take 5,000 Units by mouth at bedtime.    . fluticasone (FLONASE) 50 MCG/ACT nasal spray Place 2 sprays into both nostrils daily. 16 g 2  . gabapentin (NEURONTIN) 300 MG capsule TAKE 1 CAPSULE(300 MG) BY MOUTH THREE TIMES DAILY (Patient taking differently: Take 300 mg by mouth 2 (two) times daily. ) 90 capsule 5  . glucose blood (ONE TOUCH ULTRA TEST) test strip 1 each by Other route 2 (two) times daily. And lancets 2/day (Patient taking differently: 1 each by Other route daily. ) 100 each 12  . insulin aspart (NOVOLOG FLEXPEN) 100 UNIT/ML FlexPen Inject 30 Units into the skin daily with supper. (Patient taking differently: Inject 10 Units into the skin daily before lunch. ) 15 mL 2  . Insulin Glargine (LANTUS SOLOSTAR) 100 UNIT/ML Solostar Pen Inject 90 Units into the skin every morning. (Patient taking differently: Inject 40 Units into the skin 2 (two) times daily. ) 15 pen 11  . losartan (COZAAR) 25 MG tablet Take 1 tablet (25 mg total) by mouth daily. 30 tablet 1  . Multiple Vitamin (MULTIVITAMIN WITH MINERALS) TABS tablet Take 1 tablet by mouth at bedtime.    Marland Kitchen  mupirocin ointment (BACTROBAN) 2 % Apply 1 application topically 3 (three) times daily. 22 g 1  . Polyethyl Glycol-Propyl Glycol (SYSTANE) 0.4-0.3 % GEL ophthalmic gel Place 1 application into both eyes 2 (two) times daily as needed (for dry/irritated eyes).    Marland Kitchen PROAIR HFA 108 (90 Base) MCG/ACT inhaler INHALE 2 PUFFS INTO THE LUNGS EVERY 6 HOURS AS NEEDED FOR WHEEZING OR SHORTNESS OF BREATH 8.5 g 0  . venlafaxine (EFFEXOR) 75 MG tablet Take 1 tablet (75 mg total) by mouth 2 (two) times daily. 60 tablet 2  . Liraglutide (VICTOZA) 18 MG/3ML SOPN Inject 0.3 mLs (1.8 mg total) into the skin daily. As discussed (Patient not taking: Reported on 06/23/2016) 18 mL 3  . ondansetron  (ZOFRAN-ODT) 4 MG disintegrating tablet Take 4 mg by mouth every 6 (six) hours as needed for nausea.  0  . pantoprazole (PROTONIX) 40 MG tablet Take 40 mg by mouth daily.  2  . polyethylene glycol powder (GLYCOLAX/MIRALAX) powder Mix 17 gms in 8 oz of H2O bid for 5-7 days (Patient not taking: Reported on 06/23/2016) 289 g 0   No current facility-administered medications on file prior to visit.     Allergies  Allergen Reactions  . Lisinopril Cough    Family History  Problem Relation Age of Onset  . Hypertension Father   . Diabetes Maternal Grandmother   . Mental illness Maternal Grandfather   . Diabetes Paternal Grandmother   . Hypertension Paternal Grandmother   . Heart disease Paternal Grandfather   . Diabetes Brother   . Mental illness Brother     BP 124/78   Pulse 74   Ht  (1.676 m)   Wt 258 lb (117 kg)   SpO2 96%   BMI 41.64 kg/m    Review of Systems Denies n/v.  She has constipation.     Objective:   Physical Exam VITAL SIGNS:  See vs page.  GENERAL: no distress.  Pulses: dorsalis pedis intact bilat.   MSK: no deformity of the feet, except for bilat bunions.   CV: trace bilat leg edema.   Skin:  no ulcer on the feet.  normal color and temp on the feet.   Neuro: sensation is intact to touch on the feet, but decreased from normal.       Assessment & Plan:  Insulin-requiring type 2 DM: since surgery, she is a candidate to transition to oral rx Bariatric surgery status, new.  We need to change metformin to immed-release. constip, new.  Metformin will help this, too.  Patient Instructions  I have sent a prescription to your pharmacy, to add the non-extended-release metformin.  Please start at 1 pill per day, then increase every few days, up to 2 pills, twice a day.   Please reduce the insulin as you can. Call if you have any question about this.   To further reduce the insulin need, we can resume the victoza, or add "acarbose."   Please call us next week,  to tell us how the blood sugar is doing.    Our goal is to go off the insulin.  check your blood sugar twice a day.  vary the time of day when you check, between before the 3 meals, and at bedtime.  also check if you have symptoms of your blood sugar being too high or too low.  please keep a record of the readings and bring it to your next appointment here (or you can bring the meter itself).  You can write it on any piece of paper.  please call us sooner if your blood sugar goes below 70, or if you have a lot of readings over 200.   Please come back for a follow-up appointment in 1 month.

## 2016-06-23 NOTE — Progress Notes (Signed)
Bariatric Class:  Appt start time: 1530 end time:  1630.  2 Week Post-Operative Nutrition Class  Patient was seen on 06/23/2016 for Post-[facility]ative Nutrition [facility]   Pt arrived late for the class and states she has been mixing berries with her protein shake. When advised to stop the berries pt states she did not know she was not supposed to eat fruit: Dietitian educated the group on this topic.  Pt staets her glucose numbers have been 120-150 Surgery date: 06/08/2016 Surgery type: RYGB Start weight at [facility]: 274 Weight today: 255.6  TANITA  BODY COMP RESULTS  06/23/2016   BMI (kg/m^2) 41.3   Fat Mass (lbs) 112   Fat Free Mass (lbs) 143.6   Total Body Water (lbs) 104.8   The following the learning objectives were met by the patient during this course:  Identifies Phase 3A (Soft, High Proteins) Dietary Goals and will begin from 2 weeks post-operatively to 2 months post-operatively  Identifies appropriate sources of fluids and proteins   States protein recommendations and appropriate sources post-operatively  Identifies the need for appropriate texture modifications, mastication, and bite sizes when consuming solids  Identifies appropriate multivitamin and calcium sources post-operatively  Describes the need for physical activity post-operatively and will follow MD recommendations  States when to call healthcare provider regarding medication questions or post-operative complications  Handouts given during class include:  Phase 3A: Soft, High Protein Diet Handout  Follow-Up Plan: Patient will follow-up at [facility] in 6 weeks for 2 month post-op nutrition visit for diet advancement per MD.

## 2016-06-29 ENCOUNTER — Ambulatory Visit: Payer: BLUE CROSS/BLUE SHIELD | Admitting: Endocrinology

## 2016-07-24 NOTE — Addendum Note (Signed)
Addendum  created 07/24/16 1246 by Jordynne Mccown D, MD   Sign clinical note    

## 2016-07-30 ENCOUNTER — Encounter: Payer: Self-pay | Admitting: Endocrinology

## 2016-07-30 ENCOUNTER — Ambulatory Visit (INDEPENDENT_AMBULATORY_CARE_PROVIDER_SITE_OTHER): Payer: BLUE CROSS/BLUE SHIELD | Admitting: Endocrinology

## 2016-07-30 VITALS — BP 126/84 | HR 83 | Ht 66.0 in | Wt 249.0 lb

## 2016-07-30 DIAGNOSIS — E1140 Type 2 diabetes mellitus with diabetic neuropathy, unspecified: Secondary | ICD-10-CM | POA: Diagnosis not present

## 2016-07-30 DIAGNOSIS — Z794 Long term (current) use of insulin: Secondary | ICD-10-CM

## 2016-07-30 LAB — POCT GLYCOSYLATED HEMOGLOBIN (HGB A1C): HEMOGLOBIN A1C: 6.4

## 2016-07-30 MED ORDER — INSULIN GLARGINE 100 UNIT/ML SOLOSTAR PEN: 20.0000 [IU] | PEN_INJECTOR | SUBCUTANEOUS | 11 refills | Status: DC

## 2016-07-30 MED ORDER — METFORMIN HCL ER 500 MG PO TB24: 2000.0000 mg | ORAL_TABLET | Freq: Every day | ORAL | 11 refills | Status: DC

## 2016-07-30 NOTE — Patient Instructions (Addendum)
keep the blisters covered with antibiotic ointment and large bandaids.  Please reduce the lantus to 20 units each morning, then continue to reduce as you can Our goal is to go off the insulin.  Please continue the same other diabetes medications, but I have changed the metformin to extended-release check your blood sugar twice a day.  vary the time of day when you check, between before the 3 meals, and at bedtime.  also check if you have symptoms of your blood sugar being too high or too low.  please keep a record of the readings and bring it to your next appointment here (or you can bring the meter itself).  You can write it on any piece of paper.  please call us sooner if your blood sugar goes below 70, or if you have a lot of readings over 200.   Please come back for a follow-up appointment in 3 months.

## 2016-07-30 NOTE — Progress Notes (Signed)
Subjective:    Patient ID: Shelby Scott, female    DOB: [birth year], 51 y.o.   MRN: [id number]  HPI Pt returns for f/u of diabetes mellitus: DM type: Insulin-requiring type 2 Dx'ed: 2005 Complications: polyneuropathy Therapy: insulin since 2013, metformin, and victoza GDM: never DKA: never Severe hypoglycemia: never. Pancreatitis: never.  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   Interval history: She is on a soft diet.  She has lost 30 lbs so far, including preop diet.  She takes lantus, 20 units bid. However, she skips approx 3 doses per week  cbg's are in the low to mid-100's.  She stopped taking the losartan.   Past Medical History:  Diagnosis Date  . Anemia   . Anxiety   . Asthma   . Depression   . Diabetes mellitus without complication (HCC)   . Hypertension     Past Surgical History:  Procedure Laterality Date  . CHOLECYSTECTOMY    . GASTRIC ROUX-EN-Y N/A 06/08/2016   Procedure: LAPAROSCOPIC ROUX-EN-Y GASTRIC BYPASS WITH UPPER ENDOSCOPY;  Surgeon: Luretha MurphyMatthew Martin, MD;  Location: [facility];  Service: General;  Laterality: N/A;  . UPPER GI ENDOSCOPY  06/08/2016   Procedure: UPPER GI ENDOSCOPY;  Surgeon: Luretha MurphyMatthew Martin, MD;  Location: [facility];  Service: General;;    Social History   Social History  . Marital status: Divorced    Spouse name: N/A  . Number of children: N/A  . Years of education: N/A   Occupational History  . Not on file.   Social History Main Topics  . Smoking status: Never Smoker  . Smokeless tobacco: Never Used  . Alcohol use No  . Drug use: No  . Sexual activity: No   Other Topics Concern  . Not on file   Social History Narrative  . No narrative on file    Current Outpatient Prescriptions on File Prior to Visit  Medication Sig Dispense Refill  . ACETYLCARN-ALPHA LIPOIC ACID PO Take 1 tablet by mouth 2 (two) times daily.    . Adapalene-Benzoyl Peroxide (EPIDUO  EX) Apply 1 application topically at bedtime.    . B Complex-C (B-COMPLEX WITH VITAMIN C) tablet Take 1 tablet by mouth daily.    . Biotin 1000 MCG tablet Take 1,000 mcg by mouth 2 (two) times daily.     . Cholecalciferol (VITAMIN D-3) 5000 units TABS Take 5,000 Units by mouth at bedtime.    . fluticasone (FLONASE) 50 MCG/ACT nasal spray Place 2 sprays into both nostrils daily. 16 g 2  . gabapentin (NEURONTIN) 300 MG capsule TAKE 1 CAPSULE(300 MG) BY MOUTH THREE TIMES DAILY (Patient taking differently: Take 300 mg by mouth 2 (two) times daily. ) 90 capsule 5  . glucose blood (ONE TOUCH ULTRA TEST) test strip 1 each by Other route 2 (two) times daily. And lancets 2/day (Patient taking differently: 1 each by Other route daily. ) 100 each 12  . Liraglutide (VICTOZA) 18 MG/3ML SOPN Inject 0.3 mLs (1.8 mg total) into the skin daily. As discussed 18 mL 3  . Multiple Vitamin (MULTIVITAMIN WITH MINERALS) TABS tablet Take 1 tablet by mouth at bedtime.    . mupirocin ointment (BACTROBAN) 2 % Apply 1 application topically 3 (three) times daily. 22 g 1  . pantoprazole (PROTONIX) 40 MG tablet Take 40 mg by mouth daily.  2  . Polyethyl Glycol-Propyl Glycol (SYSTANE) 0.4-0.3 % GEL ophthalmic gel Place 1 application into  both eyes 2 (two) times daily as needed (for dry/irritated eyes).    Marland Kitchen PROAIR HFA 108 (90 Base) MCG/ACT inhaler INHALE 2 PUFFS INTO THE LUNGS EVERY 6 HOURS AS NEEDED FOR WHEEZING OR SHORTNESS OF BREATH 8.5 g 0  . venlafaxine (EFFEXOR) 75 MG tablet Take 1 tablet (75 mg total) by mouth 2 (two) times daily. 60 tablet 2  . ondansetron (ZOFRAN-ODT) 4 MG disintegrating tablet Take 4 mg by mouth every 6 (six) hours as needed for nausea.  0  . polyethylene glycol powder (GLYCOLAX/MIRALAX) powder Mix 17 gms in 8 oz of H2O bid for 5-7 days (Patient not taking: Reported on 07/30/2016) 289 g 0   No current facility-administered medications on file prior to visit.     Allergies  Allergen Reactions  .  Lisinopril Cough    Family History  Problem Relation Age of Onset  . Hypertension Father   . Diabetes Maternal Grandmother   . Mental illness Maternal Grandfather   . Diabetes Paternal Grandmother   . Hypertension Paternal Grandmother   . Heart disease Paternal Grandfather   . Diabetes Brother   . Mental illness Brother     BP 126/84   Pulse 83   Ht 5\' 6"  (1.676 m)   Wt 249 lb (112.9 kg)   SpO2 96%   BMI 40.19 kg/m   Review of Systems She denies hypoglycemia.      Objective:   Physical Exam VITAL SIGNS:  See vs page.  GENERAL: no distress.  Pulses: dorsalis pedis intact bilat.   MSK: no deformity of the feet, except for bilat bunions.   CV: trace bilat leg edema.   Skin:  no ulcer on the feet, but there are blisters on the plantar aspects of both feet.  normal color and temp on the feet.   Neuro: sensation is intact to touch on the feet, but decreased from normal.    Lab Results  Component Value Date   HGBA1C 6.4 07/30/2016      Assessment & Plan:  Insulin-requiring type 2 DM, with polyneuropathy: overcontrolled, given this regimen, which does match insulin to her changing needs throughout the day Obesity, improved with surgery.  Blisters, new HTN: well-controlled off rx  Patient Instructions  keep the blisters covered with antibiotic ointment and large bandaids.  Please reduce the lantus to 20 units each morning, then continue to reduce as you can Our goal is to go off the insulin.  Please continue the same other diabetes medications, but I have changed the metformin to extended-release check your blood sugar twice a day.  vary the time of day when you check, between before the 3 meals, and at bedtime.  also check if you have symptoms of your blood sugar being too high or too low.  please keep a record of the readings and bring it to your next appointment here (or you can bring the meter itself).  You can write it on any piece of paper.  please call us sooner if  your blood sugar goes below 70, or if you have a lot of readings over 200.   Please come back for a follow-up appointment in 3 months.

## 2016-08-03 ENCOUNTER — Other Ambulatory Visit: Payer: Self-pay | Admitting: Physician Assistant

## 2016-08-03 DIAGNOSIS — F329 Major depressive disorder, single episode, unspecified: Secondary | ICD-10-CM

## 2016-08-03 DIAGNOSIS — F32A Depression, unspecified: Secondary | ICD-10-CM

## 2016-08-03 DIAGNOSIS — F419 Anxiety disorder, unspecified: Principal | ICD-10-CM

## 2016-08-04 ENCOUNTER — Encounter: Payer: Self-pay | Admitting: Registered"

## 2016-08-04 ENCOUNTER — Encounter: Payer: BLUE CROSS/BLUE SHIELD | Attending: Surgery | Admitting: Registered"

## 2016-08-04 DIAGNOSIS — F329 Major depressive disorder, single episode, unspecified: Secondary | ICD-10-CM | POA: Diagnosis not present

## 2016-08-04 DIAGNOSIS — Z713 Dietary counseling and surveillance: Secondary | ICD-10-CM | POA: Diagnosis not present

## 2016-08-04 DIAGNOSIS — E1140 Type 2 diabetes mellitus with diabetic neuropathy, unspecified: Secondary | ICD-10-CM | POA: Insufficient documentation

## 2016-08-04 DIAGNOSIS — E119 Type 2 diabetes mellitus without complications: Secondary | ICD-10-CM

## 2016-08-04 DIAGNOSIS — Z6841 Body Mass Index (BMI) 40.0 and over, adult: Secondary | ICD-10-CM | POA: Insufficient documentation

## 2016-08-04 NOTE — Patient Instructions (Signed)
-   Continue to take smaller bites (the size of a dime or a thumbnail) and chew food well (at least 30 times per bite or applesauce consistency).  - Track your food and fluid intake daily.

## 2016-08-04 NOTE — Progress Notes (Signed)
Follow-up visit:  8 Weeks Post-Operative RYGB Surgery  Medical Nutrition Therapy:  Appt start time: 8:00 end time:  8:45.  Primary concerns today: Post-operative Bariatric Surgery Nutrition Management.  Non scale victories: sleeps better, not snoring, more comfortable at night, feels better during the day  Surgery date: 06/08/2016 Surgery type: [facility]GB [facility] Weight today: 248.4 lbs Weight change: 7.2 lbs from 255.6 on 06/23/2016 Total weight lost: 25.6 lbs Weight loss goal: increase mobility   TANITA  BODY COMP RESULTS  06/23/2016 08/04/2016   BMI (kg/m^2) 41.3 40.1   Fat Mass (lbs) 112 107.8   Fat Free Mass (lbs) 143.6 140.6   Total Body Water (lbs) 104.8 102.4    Pt states she she can't eat chicken. Pt states chicken  hurts and causes her to vomit when she tried it once. Pt states she has blisters and is unable to walk as physical activity right now. Pt states she has IBS with a lot of constipation. Pt states Miralax doesn't work and has been using Senna tea which helps with constipation and IBS. Pt states she cannot tolerate greasy or fried food.    Preferred Learning Style:   No preference indicated   Learning Readiness:   Ready  Change in progress  24-hr recall: B (AM): protein shake (30g) Snk (AM): beef jerky  (7g) L (PM): boiled egg (6g) with low fat mayo, greek yogurt total zero (15g) Snk (PM): 1/2 protein shake (15g)  D (PM): protein shake (30g) or pinto beans  Snk (PM): coffee (20 oz. no sugar), water, Diet Orangeade twist  Fluid intake: 64+ oz  Estimated total protein intake: 60+ grams  Medications: See list Supplementation: 2 MVI + 3 Ca supp  CBG monitoring: once or twice a day Average CBG per patient: FBS (130) at night (120-130) Last patient reported A1c: not reported  Using straws: no Drinking while eating: no Having you been chewing well: yes Chewing/swallowing difficulties: no Changes in vision: no Changes to  mood/headaches: no Hair loss/Changes to skin/Changes to nails: some hair thinning (taking Biotin and supplements to help) Any difficulty focusing or concentrating: no Sweating: no Dizziness/Lightheaded: no Palpitations: sometimes when stressed, normal Carbonated beverages: no N/V/D/C/GAS: nausea and vomiting with chicken; constipation due to IBS; yes to gas Abdominal Pain: no Dumping syndrome: no Last Lap-Band fill: N/A  Recent physical activity:  Minimal walking  Progress Towards Goal(s):  In progress.  Handouts given during visit include:  Phase IIIB: High protein and NS vegetables   Nutritional Diagnosis:  NI-5.8.5 Inadeqate fiber intake As related to Bariatric high protein post-op diet.  As evidenced by patient reported constipation and dietary recall of high protein food.    Intervention:  Nutrition counseling and education.  Goals: - Continue to take smaller bites (the size of a dime or a thumbnail) and chew food well (at least 30 times per bite or applesauce consistency). - Track your food and fluid intake daily.   Teaching Method Utilized:  Visual Auditory  Barriers to learning/adherence to lifestyle change: none  Demonstrated degree of understanding via:  Teach Back   Monitoring/Evaluation:  Dietary intake, exercise, lap band fills, and body weight. Follow up in 3 months for 5 month post-op visit.

## 2016-08-08 ENCOUNTER — Encounter: Payer: Self-pay | Admitting: Physician Assistant

## 2016-08-08 ENCOUNTER — Ambulatory Visit (INDEPENDENT_AMBULATORY_CARE_PROVIDER_SITE_OTHER): Payer: BLUE CROSS/BLUE SHIELD | Admitting: Physician Assistant

## 2016-08-08 ENCOUNTER — Other Ambulatory Visit: Payer: Self-pay | Admitting: Physician Assistant

## 2016-08-08 ENCOUNTER — Ambulatory Visit (INDEPENDENT_AMBULATORY_CARE_PROVIDER_SITE_OTHER): Payer: BLUE CROSS/BLUE SHIELD

## 2016-08-08 VITALS — BP 137/74 | HR 90 | Temp 98.2°F | Resp 16 | Ht 66.0 in | Wt 246.6 lb

## 2016-08-08 DIAGNOSIS — G8929 Other chronic pain: Secondary | ICD-10-CM | POA: Diagnosis not present

## 2016-08-08 DIAGNOSIS — M25561 Pain in right knee: Secondary | ICD-10-CM | POA: Diagnosis not present

## 2016-08-08 DIAGNOSIS — B379 Candidiasis, unspecified: Secondary | ICD-10-CM

## 2016-08-08 DIAGNOSIS — F32A Depression, unspecified: Secondary | ICD-10-CM

## 2016-08-08 DIAGNOSIS — F329 Major depressive disorder, single episode, unspecified: Secondary | ICD-10-CM | POA: Diagnosis not present

## 2016-08-08 DIAGNOSIS — F419 Anxiety disorder, unspecified: Secondary | ICD-10-CM

## 2016-08-08 DIAGNOSIS — M25571 Pain in right ankle and joints of right foot: Secondary | ICD-10-CM | POA: Diagnosis not present

## 2016-08-08 MED ORDER — VENLAFAXINE HCL ER 150 MG PO CP24: 150.0000 mg | ORAL_CAPSULE | Freq: Every day | ORAL | 1 refills | Status: DC

## 2016-08-08 NOTE — Patient Instructions (Addendum)
For knee pain, I recommend using heat to the affected area daily. Begin doing the exercises below. Start engaging more in non weight bearing activities like cycling and swimming. I also think yoga would be great for you. Please contact your surgeon and ask about the use of Aleve after your surgery. Also ask what he recommends for pain. I have placed an orthopedic referral and they should contact you within the next 1-2 weeks with an appointment.   In terms of anxiety and depression, I have refilled your effexor 150mg  XR. I also recommend seeing a counselor very soon to discuss all of the things you need to. Follow up with me in 3 months for reevaluation. If symptoms worsen before then, please seek care immediately.    Knee Exercises Ask your health care provider which exercises are safe for you. Do exercises exactly as told by your health care provider and adjust them as directed. It is normal to feel mild stretching, pulling, tightness, or discomfort as you do these exercises, but you should stop right away if you feel sudden pain or your pain gets worse.Do not begin these exercises until told by your health care provider.  STRETCHING AND RANGE OF MOTION EXERCISES These exercises warm up your muscles and joints and improve the movement and flexibility of your knee. These exercises also help to relieve pain, numbness, and tingling. Exercise A: Knee Extension, Prone 1. Lie on your abdomen on a bed. 2. Place your left / right knee just beyond the edge of the surface so your knee is not on the bed. You can put a towel under your left / right thigh just above your knee for comfort. 3. Relax your leg muscles and allow gravity to straighten your knee. You should feel a stretch behind your left / right knee. 4. Hold this position for __________ seconds. 5. Scoot up so your knee is supported between repetitions. Repeat __________ times. Complete this stretch __________ times a day. Exercise B: Knee  Flexion, Active  1. Lie on your back with both knees straight. If this causes back discomfort, bend your left / right knee so your foot is flat on the floor. 2. Slowly slide your left / right heel back toward your buttocks until you feel a gentle stretch in the front of your knee or thigh. 3. Hold this position for __________ seconds. 4. Slowly slide your left / right heel back to the starting position. Repeat __________ times. Complete this exercise __________ times a day. Exercise C: Quadriceps, Prone  1. Lie on your abdomen on a firm surface, such as a bed or padded floor. 2. Bend your left / right knee and hold your ankle. If you cannot reach your ankle or pant leg, loop a belt around your foot and grab the belt instead. 3. Gently pull your heel toward your buttocks. Your knee should not slide out to the side. You should feel a stretch in the front of your thigh and knee. 4. Hold this position for __________ seconds. Repeat __________ times. Complete this stretch __________ times a day. Exercise D: Hamstring, Supine 1. Lie on your back. 2. Loop a belt or towel over the ball of your left / right foot. The ball of your foot is on the walking surface, right under your toes. 3. Straighten your left / right knee and slowly pull on the belt to raise your leg until you feel a gentle stretch behind your knee. ? Do not let your left / right  knee bend while you do this. ? Keep your other leg flat on the floor. 4. Hold this position for __________ seconds. Repeat __________ times. Complete this stretch __________ times a day. STRENGTHENING EXERCISES These exercises build strength and endurance in your knee. Endurance is the ability to use your muscles for a long time, even after they get tired. Exercise E: Quadriceps, Isometric  1. Lie on your back with your left / right leg extended and your other knee bent. Put a rolled towel or small pillow under your knee if told by your health care  provider. 2. Slowly tense the muscles in the front of your left / right thigh. You should see your kneecap slide up toward your hip or see increased dimpling just above the knee. This motion will push the back of the knee toward the floor. 3. For __________ seconds, keep the muscle as tight as you can without increasing your pain. 4. Relax the muscles slowly and completely. Repeat __________ times. Complete this exercise __________ times a day. Exercise F: Straight Leg Raises - Quadriceps 1. Lie on your back with your left / right leg extended and your other knee bent. 2. Tense the muscles in the front of your left / right thigh. You should see your kneecap slide up or see increased dimpling just above the knee. Your thigh may even shake a bit. 3. Keep these muscles tight as you raise your leg 4-6 inches (10-15 cm) off the floor. Do not let your knee bend. 4. Hold this position for __________ seconds. 5. Keep these muscles tense as you lower your leg. 6. Relax your muscles slowly and completely after each repetition. Repeat __________ times. Complete this exercise __________ times a day. Exercise G: Hamstring, Isometric 1. Lie on your back on a firm surface. 2. Bend your left / right knee approximately __________ degrees. 3. Dig your left / right heel into the surface as if you are trying to pull it toward your buttocks. Tighten the muscles in the back of your thighs to dig as hard as you can without increasing any pain. 4. Hold this position for __________ seconds. 5. Release the tension gradually and allow your muscles to relax completely for __________ seconds after each repetition. Repeat __________ times. Complete this exercise __________ times a day. Exercise H: Hamstring Curls  If told by your health care provider, do this exercise while wearing ankle weights. Begin with __________ weights. Then increase the weight by 1 lb (0.5 kg) increments. Do not wear ankle weights that are more than  __________. 1. Lie on your abdomen with your legs straight. 2. Bend your left / right knee as far as you can without feeling pain. Keep your hips flat against the floor. 3. Hold this position for __________ seconds. 4. Slowly lower your leg to the starting position.  Repeat __________ times. Complete this exercise __________ times a day. Exercise I: Squats (Quadriceps) 1. Stand in front of a table, with your feet and knees pointing straight ahead. You may rest your hands on the table for balance but not for support. 2. Slowly bend your knees and lower your hips like you are going to sit in a chair. ? Keep your weight over your heels, not over your toes. ? Keep your lower legs upright so they are parallel with the table legs. ? Do not let your hips go lower than your knees. ? Do not bend lower than told by your health care provider. ? If your knee  pain increases, do not bend as low. 3. Hold the squat position for __________ seconds. 4. Slowly push with your legs to return to standing. Do not use your hands to pull yourself to standing. Repeat __________ times. Complete this exercise __________ times a day. Exercise J: Wall Slides (Quadriceps)  1. Lean your back against a smooth wall or door while you walk your feet out 18-24 inches (46-61 cm) from it. 2. Place your feet hip-width apart. 3. Slowly slide down the wall or door until your knees bend __________ degrees. Keep your knees over your heels, not over your toes. Keep your knees in line with your hips. 4. Hold for __________ seconds. Repeat __________ times. Complete this exercise __________ times a day. Exercise K: Straight Leg Raises - Hip Abductors 1. Lie on your side with your left / right leg in the top position. Lie so your head, shoulder, knee, and hip line up. You may bend your bottom knee to help you keep your balance. 2. Roll your hips slightly forward so your hips are stacked directly over each other and your left / right  knee is facing forward. 3. Leading with your heel, lift your top leg 4-6 inches (10-15 cm). You should feel the muscles in your outer hip lifting. ? Do not let your foot drift forward. ? Do not let your knee roll toward the ceiling. 4. Hold this position for __________ seconds. 5. Slowly return your leg to the starting position. 6. Let your muscles relax completely after each repetition. Repeat __________ times. Complete this exercise __________ times a day. Exercise L: Straight Leg Raises - Hip Extensors 1. Lie on your abdomen on a firm surface. You can put a pillow under your hips if that is more comfortable. 2. Tense the muscles in your buttocks and lift your left / right leg about 4-6 inches (10-15 cm). Keep your knee straight as you lift your leg. 3. Hold this position for __________ seconds. 4. Slowly lower your leg to the starting position. 5. Let your leg relax completely after each repetition. Repeat __________ times. Complete this exercise __________ times a day. This information is not intended to replace advice given to you by your health care provider. Make sure you discuss any questions you have with your health care provider. Document Released: 12/24/2004 Document Revised: 11/04/2015 Document Reviewed: 12/16/2014 Elsevier Interactive Patient Education  2018 [employer].     IF you received an x-ray today, you will receive an invoice from [facility]. Please contact [facility] at [phone] with questions or concerns regarding your invoice.   IF you received labwork today, you will receive an invoice from [facility]. Please contact [facility] at [phone] with questions or concerns regarding your invoice.   Our billing staff will not be able to assist you with questions regarding bills from these companies.  You will be contacted with the lab results as soon as they are available. The fastest way to get your results is to activate your My Chart account.  Instructions are located on the last page of this paperwork. If you have not heard from [place] regarding the results in 2 weeks, please contact this office.
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
## 2016-08-09 LAB — CMP14+EGFR
ALT: 19 IU/L (ref 0–32)
AST: 23 IU/L (ref 0–40)
Albumin/Globulin Ratio: 1.5 (ref 1.2–2.2)
Albumin: 4.3 g/dL (ref 3.5–5.5)
Alkaline Phosphatase: 129 IU/L — ABNORMAL HIGH (ref 39–117)
BUN/Creatinine Ratio: 14 (ref 9–23)
BUN: 11 mg/dL (ref 6–24)
Bilirubin Total: 0.2 mg/dL (ref 0.0–1.2)
CALCIUM: 9.5 mg/dL (ref 8.7–10.2)
CO2: 22 mmol/L (ref 20–29)
CREATININE: 0.78 mg/dL (ref 0.57–1.00)
Chloride: 101 mmol/L (ref 96–106)
GFR, EST AFRICAN AMERICAN: 102 mL/min/{1.73_m2} (ref >59)
GFR, EST NON AFRICAN AMERICAN: 88 mL/min/{1.73_m2} (ref >59)
GLUCOSE: 117 mg/dL — AB (ref 65–99)
Globulin, Total: 2.9 g/dL (ref 1.5–4.5)
Potassium: 4.2 mmol/L (ref 3.5–5.2)
Sodium: 140 mmol/L (ref 134–144)
TOTAL PROTEIN: 7.2 g/dL (ref 6.0–8.5)

## 2016-08-15 ENCOUNTER — Other Ambulatory Visit: Payer: Self-pay | Admitting: Endocrinology

## 2016-09-03 ENCOUNTER — Encounter (INDEPENDENT_AMBULATORY_CARE_PROVIDER_SITE_OTHER): Payer: Self-pay | Admitting: Orthopedic Surgery

## 2016-09-03 ENCOUNTER — Ambulatory Visit (INDEPENDENT_AMBULATORY_CARE_PROVIDER_SITE_OTHER): Payer: BLUE CROSS/BLUE SHIELD | Admitting: Orthopedic Surgery

## 2016-09-03 ENCOUNTER — Ambulatory Visit (INDEPENDENT_AMBULATORY_CARE_PROVIDER_SITE_OTHER): Payer: BLUE CROSS/BLUE SHIELD

## 2016-09-03 DIAGNOSIS — M25512 Pain in left shoulder: Secondary | ICD-10-CM | POA: Diagnosis not present

## 2016-09-03 DIAGNOSIS — M25561 Pain in right knee: Secondary | ICD-10-CM

## 2016-09-03 DIAGNOSIS — G8929 Other chronic pain: Secondary | ICD-10-CM
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
## 2016-09-08 ENCOUNTER — Other Ambulatory Visit: Payer: Self-pay | Admitting: Endocrinology

## 2016-09-08 ENCOUNTER — Other Ambulatory Visit: Payer: Self-pay | Admitting: Physician Assistant

## 2016-09-08 DIAGNOSIS — F329 Major depressive disorder, single episode, unspecified: Secondary | ICD-10-CM

## 2016-09-08 DIAGNOSIS — F419 Anxiety disorder, unspecified: Principal | ICD-10-CM

## 2016-09-08 DIAGNOSIS — F32A Depression, unspecified: Secondary | ICD-10-CM

## 2016-09-23 ENCOUNTER — Other Ambulatory Visit: Payer: BLUE CROSS/BLUE SHIELD

## 2016-10-03 ENCOUNTER — Encounter: Payer: Self-pay | Admitting: Family Medicine

## 2016-10-03 ENCOUNTER — Ambulatory Visit (INDEPENDENT_AMBULATORY_CARE_PROVIDER_SITE_OTHER): Payer: BLUE CROSS/BLUE SHIELD | Admitting: Family Medicine

## 2016-10-03 VITALS — BP 128/84 | HR 82 | Temp 98.9°F | Resp 20 | Ht 65.25 in | Wt 237.2 lb

## 2016-10-03 DIAGNOSIS — F329 Major depressive disorder, single episode, unspecified: Secondary | ICD-10-CM

## 2016-10-03 DIAGNOSIS — T50995A Adverse effect of other drugs, medicaments and biological substances, initial encounter: Secondary | ICD-10-CM | POA: Diagnosis not present

## 2016-10-03 DIAGNOSIS — F419 Anxiety disorder, unspecified: Secondary | ICD-10-CM

## 2016-10-03 DIAGNOSIS — F418 Other specified anxiety disorders: Secondary | ICD-10-CM

## 2016-10-03 DIAGNOSIS — Z1231 Encounter for screening mammogram for malignant neoplasm of breast: Secondary | ICD-10-CM | POA: Diagnosis not present

## 2016-10-03 DIAGNOSIS — F32A Depression, unspecified: Secondary | ICD-10-CM

## 2016-10-03 DIAGNOSIS — Z1239 Encounter for other screening for malignant neoplasm of breast: Secondary | ICD-10-CM

## 2016-10-03 DIAGNOSIS — T43205A Adverse effect of unspecified antidepressants, initial encounter: Secondary | ICD-10-CM

## 2016-10-03 MED ORDER — VENLAFAXINE HCL ER 37.5 MG PO CP24: 37.5000 mg | ORAL_CAPSULE | Freq: Every day | ORAL | 1 refills | Status: DC

## 2016-10-03 MED ORDER — VENLAFAXINE HCL ER 150 MG PO CP24: 150.0000 mg | ORAL_CAPSULE | Freq: Every day | ORAL | 1 refills | Status: DC

## 2016-10-03 NOTE — Patient Instructions (Addendum)
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 Restart Effexor 159 g each day. In the next 2-3 weeks, if you are still feeling significant anxiety, add additional dose of Effexor 37.5 mg once per day. You will take both pills each day. Follow-up with myself or [place] in the next 6-8 weeks.  As we discussed it is an important part of your treatment to meet with a therapist, and cognitive behavioral therapy may help.   [facility]: : [phone]  Stress and Stress Management Stress is a normal reaction to life events. It is what you feel when life demands more than you are used to or more than you can handle. Some stress can be useful. For example, the stress reaction can help you catch the last bus of the day, study for a test, or meet a deadline at work. But stress that occurs too often or for too long can cause problems. It can affect your emotional health and interfere with relationships and normal daily activities. Too much stress can weaken your immune system and increase your risk for physical illness. If you already have a medical problem, stress can make it worse. What are the causes? All sorts of life events may cause stress. An event that causes stress for one person may not be stressful for another person. Major life events commonly cause stress. These may be positive or negative. Examples include losing your job, moving into a new home, getting married, having a baby, or losing a loved one. Less obvious life events may also cause stress, especially if they occur day after day or in combination. Examples include working long hours, driving in traffic, caring for children, being in debt, or being in a difficult relationship. What are the signs or symptoms? Stress may cause emotional symptoms including, the following:  Anxiety. This is feeling worried, afraid, on edge, overwhelmed, or out of control.  Anger. This is feeling irritated or  impatient.  Depression. This is feeling sad, down, helpless, or guilty.  Difficulty focusing, remembering, or making decisions.  Stress may cause physical symptoms, including the following:  Aches and pains. These may affect your head, neck, back, stomach, or other areas of your body.  Tight muscles or clenched jaw.  Low energy or trouble sleeping.  Stress may cause unhealthy behaviors, including the following:  Eating to feel better (overeating) or skipping meals.  Sleeping too little, too much, or both.  Working too much or putting off tasks (procrastination).  Smoking, drinking alcohol, or using drugs to feel better.  How is this diagnosed? Stress is diagnosed through an assessment by your health care provider. Your health care provider will ask questions about your symptoms and any stressful life events.Your health care provider will also ask about your medical history and may order blood tests or other tests. Certain medical conditions and medicine can cause physical symptoms similar to stress. Mental illness can cause emotional symptoms and unhealthy behaviors similar to stress. Your health care provider may refer you to a mental health professional for further evaluation. How is this treated? Stress management is the recommended treatment for stress.The goals of stress management are reducing stressful life events and coping with stress in healthy ways. Techniques for reducing stressful life events include the following:  Stress identification. Self-monitor for stress and identify what causes stress for you. These skills may help you to avoid some stressful events.  Time management. Set your priorities, keep a calendar of events,  and learn to say "no." These tools can help you avoid making too many commitments.  Techniques for coping with stress include the following:  Rethinking the problem. Try to think realistically about stressful events rather than ignoring them or  overreacting. Try to find the positives in a stressful situation rather than focusing on the negatives.  Exercise. Physical exercise can release both physical and emotional tension. The key is to find a form of exercise you enjoy and do it regularly.  Relaxation techniques. These relax the body and mind. Examples include yoga, meditation, tai chi, biofeedback, deep breathing, progressive muscle relaxation, listening to music, being out in nature, journaling, and other hobbies. Again, the key is to find one or more that you enjoy and can do regularly.  Healthy lifestyle. Eat a balanced diet, get plenty of sleep, and do not smoke. Avoid using alcohol or drugs to relax.  Strong support network. Spend time with family, friends, or other people you enjoy being around.Express your feelings and talk things over with someone you trust.  Counseling or talktherapy with a mental health professional may be helpful if you are having difficulty managing stress on your own. Medicine is typically not recommended for the treatment of stress.Talk to your health care provider if you think you need medicine for symptoms of stress. Follow these instructions at home:  Keep all follow-up visits as directed by your health care provider.  Take all medicines as directed by your health care provider. Contact a health care provider if:  Your symptoms get worse or you start having new symptoms.  You feel overwhelmed by your problems and can no longer manage them on your own. Get help right away if:  You feel like hurting yourself or someone else. This information is not intended to replace advice given to you by your health care provider. Make sure you discuss any questions you have with your health care provider. Document Released: 08/05/2000 Document Revised: 07/18/2015 Document Reviewed: 10/04/2012 Elsevier Interactive Patient Education  2017 [employer]   Generalized Anxiety Disorder, Adult Generalized  anxiety disorder (GAD) is a mental health disorder. People with this condition constantly worry about everyday events. Unlike normal anxiety, worry related to GAD is not triggered by a specific event. These worries also do not fade or get better with time. GAD interferes with life functions, including relationships, work, and school. GAD can vary from mild to severe. People with severe GAD can have intense waves of anxiety with physical symptoms (panic attacks). What are the causes? The exact cause of GAD is not known. What increases the risk? This condition is more likely to develop in:  Women.  People who have a family history of anxiety disorders.  People who are very shy.  People who experience very stressful life events, such as the death of a loved one.  People who have a very stressful family environment.  What are the signs or symptoms? People with GAD often worry excessively about many things in their lives, such as their health and family. They may also be overly concerned about:  Doing well at work.  Being on time.  Natural disasters.  Friendships.  Physical symptoms of GAD include:  Fatigue.  Muscle tension or having muscle twitches.  Trembling or feeling shaky.  Being easily startled.  Feeling like your heart is pounding or racing.  Feeling out of breath or like you cannot take a deep breath.  Having trouble falling asleep or staying asleep.  Sweating.  Nausea,  diarrhea, or irritable bowel syndrome (IBS).  Headaches.  Trouble concentrating or remembering facts.  Restlessness.  Irritability.  How is this diagnosed? Your health care provider can diagnose GAD based on your symptoms and medical history. You will also have a physical exam. The health care provider will ask specific questions about your symptoms, including how severe they are, when they started, and if they come and go. Your health care provider may ask you about your use of alcohol or  drugs, including prescription medicines. Your health care provider may refer you to a mental health specialist for further evaluation. Your health care provider will do a thorough examination and may perform additional tests to rule out other possible causes of your symptoms. To be diagnosed with GAD, a person must have anxiety that:  Is out of his or her control.  Affects several different aspects of his or her life, such as work and relationships.  Causes distress that makes him or her unable to take part in normal activities.  Includes at least three physical symptoms of GAD, such as restlessness, fatigue, trouble concentrating, irritability, muscle tension, or sleep problems.  Before your health care provider can confirm a diagnosis of GAD, these symptoms must be present more days than they are not, and they must last for six months or longer. How is this treated? The following therapies are usually used to treat GAD:  Medicine. Antidepressant medicine is usually prescribed for long-term daily control. Antianxiety medicines may be added in severe cases, especially when panic attacks occur.  Talk therapy (psychotherapy). Certain types of talk therapy can be helpful in treating GAD by providing support, education, and guidance. Options include: ? Cognitive behavioral therapy (CBT). People learn coping skills and techniques to ease their anxiety. They learn to identify unrealistic or negative thoughts and behaviors and to replace them with positive ones. ? Acceptance and commitment therapy (ACT). This treatment teaches people how to be mindful as a way to cope with unwanted thoughts and feelings. ? Biofeedback. This process trains you to manage your body's response (physiological response) through breathing techniques and relaxation methods. You will work with a therapist while machines are used to monitor your physical symptoms.  Stress management techniques. These include yoga, meditation,  and exercise.  A mental health specialist can help determine which treatment is best for you. Some people see improvement with one type of therapy. However, other people require a combination of therapies. Follow these instructions at home:  Take over-the-counter and prescription medicines only as told by your health care provider.  Try to maintain a normal routine.  Try to anticipate stressful situations and allow extra time to manage them.  Practice any stress management or self-calming techniques as taught by your health care provider.  Do not punish yourself for setbacks or for not making progress.  Try to recognize your accomplishments, even if they are small.  Keep all follow-up visits as told by your health care provider. This is important. Contact a health care provider if:  Your symptoms do not get better.  Your symptoms get worse.  You have signs of depression, such as: ? A persistently sad, cranky, or irritable mood. ? Loss of enjoyment in activities that used to bring you joy. ? Change in weight or eating. ? Changes in sleeping habits. ? Avoiding friends or family members. ? Loss of energy for normal tasks. ? Feelings of guilt or worthlessness. Get help right away if:  You have serious thoughts about hurting yourself  or others. If you ever feel like you may hurt yourself or others, or have thoughts about taking your own life, get help right away. You can go to your nearest emergency department or call:  Your local emergency services (911 in the [place]  A suicide crisis helpline, such as the [facility] at (619)028-3696. This is open 24 hours a day.  Summary  Generalized anxiety disorder (GAD) is a mental health disorder that involves worry that is not triggered by a specific event.  People with GAD often worry excessively about many things in their lives, such as their health and family.  GAD may cause physical symptoms such as  restlessness, trouble concentrating, sleep problems, frequent sweating, nausea, diarrhea, headaches, and trembling or muscle twitching.  A mental health specialist can help determine which treatment is best for you. Some people see improvement with one type of therapy. However, other people require a combination of therapies. This information is not intended to replace advice given to you by your health care provider. Make sure you discuss any questions you have with your health care provider. Document Released: 06/06/2012 Document Revised: 12/31/2015 Document Reviewed: 12/31/2015 Elsevier Interactive Patient Education  2018 [employer].   IF you received an x-ray today, you will receive an invoice from [facility]. Please contact [facility] at [phone] with questions or concerns regarding your invoice.   IF you received labwork today, you will receive an invoice from [facility]. Please contact [facility] at [phone] with questions or concerns regarding your invoice.   Our billing staff will not be able to assist you with questions regarding bills from these companies.  You will be contacted with the lab results as soon as they are available. The fastest way to get your results is to activate your My Chart account. Instructions are located on the last page of this paperwork. If you have not heard from [place] regarding the results in 2 weeks, please contact this office.
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
## 2016-10-13 ENCOUNTER — Ambulatory Visit
Admission: RE | Admit: 2016-10-13 | Discharge: 2016-10-13 | Disposition: A | Discharge status: Home / Self Care | Payer: BLUE CROSS/BLUE SHIELD | Source: Ambulatory Visit | Attending: Orthopedic Surgery | Admitting: Orthopedic Surgery

## 2016-10-13 ENCOUNTER — Other Ambulatory Visit: Payer: Self-pay | Admitting: Physician Assistant

## 2016-10-13 ENCOUNTER — Telehealth (INDEPENDENT_AMBULATORY_CARE_PROVIDER_SITE_OTHER): Payer: Self-pay | Admitting: Radiology

## 2016-10-13 DIAGNOSIS — F329 Major depressive disorder, single episode, unspecified: Secondary | ICD-10-CM

## 2016-10-13 DIAGNOSIS — F419 Anxiety disorder, unspecified: Principal | ICD-10-CM

## 2016-10-13 DIAGNOSIS — G8929 Other chronic pain: Secondary | ICD-10-CM

## 2016-10-13 DIAGNOSIS — M19012 Primary osteoarthritis, left shoulder: Secondary | ICD-10-CM | POA: Diagnosis not present

## 2016-10-13 DIAGNOSIS — M25512 Pain in left shoulder: Principal | ICD-10-CM

## 2016-10-13 DIAGNOSIS — F32A Depression, unspecified: Secondary | ICD-10-CM

## 2016-10-13 DIAGNOSIS — L659 Nonscarring hair loss, unspecified: Secondary | ICD-10-CM | POA: Diagnosis not present

## 2016-10-13 MED ORDER — IOPAMIDOL (ISOVUE-M 200) INJECTION 41%
20.0000 mL | Freq: Once | INTRAMUSCULAR | Status: AC
  Administered 2016-10-13: 20 mL via INTRA_ARTICULAR

## 2016-10-13 NOTE — Telephone Encounter (Signed)
Danelle Earthly called to say that the auth on file had expired, I went online and did new one, Order ID # [id number], valid 10/13/2016-11/11/2016.  IC Danelle Earthly and advised.

## 2016-10-28 ENCOUNTER — Ambulatory Visit: Payer: BLUE CROSS/BLUE SHIELD | Admitting: Endocrinology

## 2016-10-31 ENCOUNTER — Encounter: Payer: Self-pay | Admitting: Physician Assistant

## 2016-10-31 ENCOUNTER — Ambulatory Visit (INDEPENDENT_AMBULATORY_CARE_PROVIDER_SITE_OTHER): Payer: BLUE CROSS/BLUE SHIELD | Admitting: Physician Assistant

## 2016-10-31 ENCOUNTER — Ambulatory Visit (INDEPENDENT_AMBULATORY_CARE_PROVIDER_SITE_OTHER): Payer: BLUE CROSS/BLUE SHIELD

## 2016-10-31 ENCOUNTER — Ambulatory Visit: Payer: BLUE CROSS/BLUE SHIELD

## 2016-10-31 VITALS — BP 135/82 | HR 80 | Temp 98.2°F | Resp 16 | Ht 66.0 in | Wt 237.8 lb

## 2016-10-31 DIAGNOSIS — M545 Low back pain, unspecified: Secondary | ICD-10-CM

## 2016-10-31 DIAGNOSIS — M533 Sacrococcygeal disorders, not elsewhere classified: Secondary | ICD-10-CM

## 2016-10-31 DIAGNOSIS — Z9884 Bariatric surgery status: Secondary | ICD-10-CM | POA: Diagnosis not present

## 2016-10-31 DIAGNOSIS — E1140 Type 2 diabetes mellitus with diabetic neuropathy, unspecified: Secondary | ICD-10-CM | POA: Diagnosis not present

## 2016-10-31 DIAGNOSIS — L659 Nonscarring hair loss, unspecified: Secondary | ICD-10-CM

## 2016-10-31 DIAGNOSIS — Z794 Long term (current) use of insulin: Secondary | ICD-10-CM | POA: Diagnosis not present

## 2016-10-31 MED ORDER — TRAMADOL HCL 50 MG PO TABS: 50.0000 mg | ORAL_TABLET | Freq: Three times a day (TID) | ORAL | 0 refills | Status: DC | PRN

## 2016-10-31 MED ORDER — CYCLOBENZAPRINE HCL 10 MG PO TABS: 5.0000 mg | ORAL_TABLET | Freq: Three times a day (TID) | ORAL | 0 refills | Status: DC | PRN

## 2016-10-31 MED ORDER — KETOROLAC TROMETHAMINE 60 MG/2ML IM SOLN
60.0000 mg | Freq: Once | INTRAMUSCULAR | Status: AC
  Administered 2016-10-31: 60 mg via INTRAMUSCULAR

## 2016-10-31 NOTE — Patient Instructions (Addendum)
Please ice the area three times per day for 15 minutes I would like you to do stretches three times per day for 15 minutes.  You may use warm compresses prior to the stretches, but then ice directly after.   Sacroiliac Joint Dysfunction Sacroiliac joint dysfunction is a condition that causes inflammation on one or both sides of the sacroiliac (SI) joint. The SI joint connects the lower part of the spine (sacrum) with the two upper portions of the pelvis (ilium). This condition causes deep aching or burning pain in the low back. In some cases, the pain may also spread into one or both buttocks or hips or spread down the legs. What are the causes? This condition may be caused by:  Pregnancy. During pregnancy, extra stress is put on the SI joints because the pelvis widens.  Injury, such as: ? Car accidents. ? Sport-related injuries. ? Work-related injuries.  Having one leg that is shorter than the other.  Conditions that affect the joints, such as: ? Rheumatoid arthritis. ? Gout. ? Psoriatic arthritis. ? Joint infection (septic arthritis).  Sometimes, the cause of SI joint dysfunction is not known. What are the signs or symptoms? Symptoms of this condition include:  Aching or burning pain in the lower back. The pain may also spread to other areas, such as: ? Buttocks. ? Groin. ? Thighs and legs.  Muscle spasms in or around the painful areas.  Increased pain when standing, walking, running, stair climbing, bending, or lifting.  How is this diagnosed? Your health care provider will do a physical exam and take your medical history. During the exam, the health care provider may move one or both of your legs to different positions to check for pain. Various tests may be done to help verify the diagnosis, including:  Imaging tests to look for other causes of pain. These may include: ? MRI. ? CT scan. ? Bone scan.  Diagnostic injection. A numbing medicine is injected into the SI  joint using a needle. If the pain is temporarily improved or stopped after the injection, this can indicate that SI joint dysfunction is the problem.  How is this treated? Treatment may vary depending on the cause and severity of your condition. Treatment options may include:  Applying ice or heat to the lower back area. This can help to reduce pain and muscle spasms.  Medicines to relieve pain or inflammation or to relax the muscles.  Wearing a back brace (sacroiliac brace) to help support the joint while your back is healing.  Physical therapy to increase muscle strength around the joint and flexibility at the joint. This may also involve learning proper body positions and ways of moving to relieve stress on the joint.  Direct manipulation of the SI joint.  Injections of steroid medicine into the joint in order to reduce pain and swelling.  Radiofrequency ablation to burn away nerves that are carrying pain messages from the joint.  Use of a device that provides electrical stimulation in order to reduce pain at the joint.  Surgery to put in screws and plates that limit or prevent joint motion. This is rare.  Follow these instructions at home:  Rest as needed. Limit your activities as directed by your health care provider.  Take medicines only as directed by your health care provider.  If directed, apply ice to the affected area: ? Put ice in a plastic bag. ? Place a towel between your skin and the bag. ? Leave the ice  on for 20 minutes, 2-3 times per day.  Use a heating pad or a moist heat pack as directed by your health care provider.  Exercise as directed by your health care provider or physical therapist.  Keep all follow-up visits as directed by your health care provider. This is important. Contact a health care provider if:  Your pain is not controlled with medicine.  You have a fever.  You have increasingly severe pain. Get help right away if:  You have  weakness, numbness, or tingling in your legs or feet.  You lose control of your bladder or bowel. This information is not intended to replace advice given to you by your health care provider. Make sure you discuss any questions you have with your health care provider. Document Released: 05/08/2008 Document Revised: 07/18/2015 Document Reviewed: 10/17/2013 Elsevier Interactive Patient Education  2018 [employer]     IF you received an x-ray today, you will receive an invoice from [facility]. Please contact [facility] at [phone] with questions or concerns regarding your invoice.   IF you received labwork today, you will receive an invoice from [facility]. Please contact [facility] at [phone] with questions or concerns regarding your invoice.   Our billing staff will not be able to assist you with questions regarding bills from these companies.  You will be contacted with the lab results as soon as they are available. The fastest way to get your results is to activate your My Chart account. Instructions are located on the last page of this paperwork. If you have not heard from us regarding the results in 2 weeks, please contact this office.
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
## 2016-11-01 LAB — CBC
Hematocrit: 38 % (ref 34.0–46.6)
Hemoglobin: 12.2 g/dL (ref 11.1–15.9)
MCH: 26.8 pg (ref 26.6–33.0)
MCHC: 32.1 g/dL (ref 31.5–35.7)
MCV: 83 fL (ref 79–97)
PLATELETS: 335 10*3/uL (ref 150–379)
RBC: 4.56 x10E6/uL (ref 3.77–5.28)
RDW: 15.1 % (ref 12.3–15.4)
WBC: 8.6 10*3/uL (ref 3.4–10.8)

## 2016-11-01 LAB — VITAMIN B12

## 2016-11-01 LAB — TSH: TSH: 1.06 u[IU]/mL (ref 0.450–4.500)

## 2016-11-01 LAB — SEDIMENTATION RATE: Sed Rate: 52 mm/hr — ABNORMAL HIGH (ref 0–40)

## 2016-11-04 ENCOUNTER — Ambulatory Visit: Payer: BLUE CROSS/BLUE SHIELD | Admitting: Registered"

## 2016-11-05 ENCOUNTER — Encounter: Payer: Self-pay | Admitting: Endocrinology

## 2016-11-05 ENCOUNTER — Encounter: Payer: BLUE CROSS/BLUE SHIELD | Attending: Surgery | Admitting: Registered"

## 2016-11-05 ENCOUNTER — Ambulatory Visit (INDEPENDENT_AMBULATORY_CARE_PROVIDER_SITE_OTHER): Payer: BLUE CROSS/BLUE SHIELD | Admitting: Endocrinology

## 2016-11-05 ENCOUNTER — Encounter: Payer: Self-pay | Admitting: Registered"

## 2016-11-05 VITALS — BP 132/72 | HR 87 | Wt 235.2 lb

## 2016-11-05 DIAGNOSIS — E119 Type 2 diabetes mellitus without complications: Secondary | ICD-10-CM

## 2016-11-05 DIAGNOSIS — E1140 Type 2 diabetes mellitus with diabetic neuropathy, unspecified: Secondary | ICD-10-CM

## 2016-11-05 DIAGNOSIS — Z794 Long term (current) use of insulin: Secondary | ICD-10-CM | POA: Diagnosis not present

## 2016-11-05 DIAGNOSIS — Z713 Dietary counseling and surveillance: Secondary | ICD-10-CM | POA: Diagnosis not present

## 2016-11-05 DIAGNOSIS — Z6841 Body Mass Index (BMI) 40.0 and over, adult: Secondary | ICD-10-CM | POA: Insufficient documentation

## 2016-11-05 DIAGNOSIS — F329 Major depressive disorder, single episode, unspecified: Secondary | ICD-10-CM | POA: Diagnosis not present

## 2016-11-05 LAB — POCT GLYCOSYLATED HEMOGLOBIN (HGB A1C): Hemoglobin A1C: 6.6

## 2016-11-05 MED ORDER — INSULIN GLARGINE 100 UNIT/ML SOLOSTAR PEN: 5.0000 [IU] | PEN_INJECTOR | SUBCUTANEOUS | 11 refills | Status: DC

## 2016-11-05 NOTE — Progress Notes (Signed)
Follow-up visit:  5 Months Post-Operative RYGB Surgery  Medical Nutrition Therapy:  Appt start time: 9:00 end time: 9:55  Primary concerns today: Post-operative Bariatric Surgery Nutrition Management.  Non scale victories: sleeps better, not snoring, more comfortable at night, feels better during the day  Surgery date: 06/08/2016 Surgery type: RYGB Start weight at [facility]: 274.0 lbs Weight today: 234.2 lbs Weight change: 14.2 lbs from 248.4 on 08/04/2016 Total weight lost: 39.8 lbs Weight loss goal: increase mobility   TANITA  BODY COMP RESULTS  06/23/2016 08/04/2016 11/05/2016   BMI (kg/m^2) 41.3 40.1 37.8   Fat Mass (lbs) 112 107.8 96.2   Fat Free Mass (lbs) 143.6 140.6 138.0   Total Body Water (lbs) 104.8 102.4 100.0    Pt's name is pronounced "Shan". Pt states she has bought walking shoes; started biking at the gym a few times a week. Pt states she has gotten her mom interested in going to the gym as well. Pt states they  motivate each other. Pt states she loves Quest protein chips from [facility] and Artic Zone ice cream. Pt states chicken does not appeal to her anymore. Pt states she tried to use a straw just to see what would happen and caused her stomach her hurt. Pt states her cat loves greek yogurt. Pt states she is no longer taking MVI chewables, taking capsules now. Pt states she was losing hair prior to surgery and has been losing it some after surgery, but has it has improved over time with taking biotin. Pt is doing a great job at reading nutrition facts labels and making conscious healthy choices from the information provided on label.   Pt states she she can't eat chicken. Pt states chicken  hurts and causes her to vomit when she tried it once. Pt states she has blisters and is unable to walk as physical activity right now. Pt states she has IBS with a lot of constipation. Pt states Miralax doesn't work and has been using Senna tea which helps with constipation and IBS. Pt states she  cannot tolerate greasy or fried food.    Preferred Learning Style:   No preference indicated   Learning Readiness:   Ready  Change in progress  24-hr recall: B (AM): protein shake (30g) Snk (AM): none L (PM): Salad with [place], ham, cheese (14g) and greek yogurt (15g) or boiled egg (6g) with low fat mayo, greek yogurt total zero (15g) Snk (PM): protein chips or flackers (flax seed crackers)  D (PM): protein shake (30g) or oriental soup (w/ pork and bok choy) or pork (21g)  Snk (PM): Artic Zone ice cream (5g)  Fluid intake: coffee (32 oz. no sugar), water (16.9 oz), diet Orangeade twist (16oz); 64+ oz  Estimated total protein intake: 60+ grams  Medications: See list Supplementation: 2 MVI capsules + 3 Ca supp + Biotin   CBG monitoring: 2-3x/day Average CBG per patient: FBS (130) evenings (120-150) Last patient reported A1c: 6.4 on 07/30/2016  Using straws: no Drinking while eating: no Having you been chewing well: yes Chewing/swallowing difficulties: no Changes in vision: no Changes to mood/headaches: no Hair loss/Changes to skin/Changes to nails: some hair thinning (taking Biotin and supplements to help) Any difficulty focusing or concentrating: no Sweating: no Dizziness/Lightheaded: no Palpitations: sometimes when stressed, normal Carbonated beverages: no N/V/D/C/GAS: no, no, no; constipation due to IBS; yes to gas taking Gas-X Abdominal Pain: no Dumping syndrome: no Last Lap-Band fill: N/A  Recent physical activity:  Walking at work 30 min,  5 days/week; gym stationary bike 30 min, 2 days/week  Progress Towards Goal(s):  In progress.  Handouts given during visit include:  none   Nutritional Diagnosis:  NI-5.8.5 Inadeqate fiber intake As related to Bariatric high protein post-op diet.  As evidenced by patient reported constipation and dietary recall of high protein food.    Intervention:  Nutrition counseling and education.  Goals: - Try using crock pot  when cooking meats. - Try G[employer]ol[employer] dressings to help tenderize meats. - Try to find Premier Protein clear drink.  - Continue to increase physical activity to at least 30-45 min, 5 days/week, including strength-training  - Try Baritastic App.    Teaching Method Utilized:  Visual Auditory  Barriers to learning/adherence to lifestyle change: none  Demonstrated degree of understanding via:  Teach Back   Monitoring/Evaluation:  Dietary intake, exercise, lap band fills, and body weight. Follow up in 3 months for 8 month post-op visit.

## 2016-11-05 NOTE — Progress Notes (Signed)
Subjective:    Patient ID: Shelby Scott, female    DOB: 1965/09/25, 51 y.o.   MRN: [id number]  HPI Pt returns for f/u of diabetes mellitus: DM type: Insulin-requiring type 2 Dx'ed: 2005 Complications: polyneuropathy Therapy: insulin since 2013, metformin, and victoza.  GDM: never DKA: never Severe hypoglycemia: never. Pancreatitis: never.  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   Interval history: She is on a soft diet.  She has lost 45 lbs so far, including preop diet.  She takes lantus, average of 15 units QD.  cbg's are well-controlled.  She seldom has hypoglycemia, and these episodes are mild.  Main symptom is bilat foot numbness.  She can swallow the metformin-XR.   Past Medical History:  Diagnosis Date  . Anemia   . Anxiety   . Asthma   . Depression   . Diabetes mellitus without complication (HCC)   . Hypertension     Past Surgical History:  Procedure Laterality Date  . CHOLECYSTECTOMY    . GASTRIC ROUX-EN-Y N/A 06/08/2016   Procedure: LAPAROSCOPIC ROUX-EN-Y GASTRIC BYPASS WITH UPPER ENDOSCOPY;  Surgeon: Luretha Murphy, MD;  Location: [facility];  Service: General;  Laterality: N/A;  . UPPER GI ENDOSCOPY  06/08/2016   Procedure: UPPER GI ENDOSCOPY;  Surgeon: Luretha Murphy, MD;  Location: [facility];  Service: General;;    Social History   Social History  . Marital status: Divorced    Spouse name: N/A  . Number of children: N/A  . Years of education: N/A   Occupational History  . Not on file.   Social History Main Topics  . Smoking status: Never Smoker  . Smokeless tobacco: Never Used  . Alcohol use No  . Drug use: No  . Sexual activity: No   Other Topics Concern  . Not on file   Social History Narrative  . No narrative on file    Current Outpatient Prescriptions on File Prior to Visit  Medication Sig Dispense Refill  . ACCU-CHEK SMARTVIEW test strip USE TWICE DAILY 100 each 11    . ACETYLCARN-ALPHA LIPOIC ACID PO Take 1 tablet by mouth 2 (two) times daily.    . Adapalene-Benzoyl Peroxide (EPIDUO EX) Apply 1 application topically at bedtime.    . B Complex-C (B-COMPLEX WITH VITAMIN C) tablet Take 1 tablet by mouth daily.    . Biotin 1000 MCG tablet Take 1,000 mcg by mouth 2 (two) times daily.     . Calcium Citrate-Vitamin D (CALCIUM CITRATE CHEWY BITE PO) Take by mouth. Take 3 times daily    . Cholecalciferol (VITAMIN D-3) 5000 units TABS Take 5,000 Units by mouth at bedtime.    Marland Kitchen CIPRODEX OTIC suspension   0  . cyclobenzaprine (FLEXERIL) 10 MG tablet Take 0.5-1 tablets (5-10 mg total) by mouth 3 (three) times daily as needed. 30 tablet 0  . fluticasone (FLONASE) 50 MCG/ACT nasal spray Place 2 sprays into both nostrils daily. 16 g 2  . gabapentin (NEURONTIN) 300 MG capsule TAKE 1 CAPSULE BY MOUTH THREE TIMES DAILY 90 capsule 0  . metFORMIN (GLUCOPHAGE-XR) 500 MG 24 hr tablet Take 4 tablets (2,000 mg total) by mouth daily with breakfast. 120 tablet 11  . Multiple Vitamin (MULTIVITAMIN WITH MINERALS) TABS tablet Take 1 tablet by mouth at bedtime.    . Multiple Vitamins-Minerals (CELEBRATE MULTI-COMPLETE 18) CHEW Chew by mouth. Take 2 daily    . mupirocin ointment (BACTROBAN) 2 % Apply  1 application topically 3 (three) times daily. 22 g 1  . ondansetron (ZOFRAN-ODT) 4 MG disintegrating tablet Take 4 mg by mouth every 6 (six) hours as needed for nausea.  0  . pantoprazole (PROTONIX) 40 MG tablet Take 40 mg by mouth daily.  2  . Polyethyl Glycol-Propyl Glycol (SYSTANE) 0.4-0.3 % GEL ophthalmic gel Place 1 application into both eyes 2 (two) times daily as needed (for dry/irritated eyes).    . polyethylene glycol powder (GLYCOLAX/MIRALAX) powder Mix 17 gms in 8 oz of H2O bid for 5-7 days 289 g 0  . PROAIR HFA 108 (90 Base) MCG/ACT inhaler INHALE 2 PUFFS INTO THE LUNGS EVERY 6 HOURS AS NEEDED FOR WHEEZING OR SHORTNESS OF BREATH 8.5 g 0  . traMADol (ULTRAM) 50 MG tablet Take 1  tablet (50 mg total) by mouth every 8 (eight) hours as needed. 21 tablet 0  . venlafaxine XR (EFFEXOR XR) 37.5 MG 24 hr capsule Take 1 capsule (37.5 mg total) by mouth daily with breakfast. 90 capsule 1  . venlafaxine XR (EFFEXOR-XR) 150 MG 24 hr capsule Take 1 capsule (150 mg total) by mouth daily with breakfast. 90 capsule 1  . VICTOZA 18 MG/3ML SOPN INJECT 1.8 MG UNDER THE SKIN DAILY AS DISCUSSED 18 mL 0   No current facility-administered medications on file prior to visit.     Allergies  Allergen Reactions  . Ibuprofen Other (See Comments)  . Lisinopril Cough    Family History  Problem Relation Age of Onset  . Hypertension Father   . Diabetes Maternal Grandmother   . Mental illness Maternal Grandfather   . Diabetes Paternal Grandmother   . Hypertension Paternal Grandmother   . Heart disease Paternal Grandfather   . Diabetes Brother   . Mental illness Brother     BP 132/72   Pulse 87   Wt 235 lb 3.2 oz (106.7 kg)   SpO2 97%   BMI 37.96 kg/m    Review of Systems Denies LOC.  Foot blisters persist.  Denies n/v.      Objective:   Physical Exam VITAL SIGNS:  See vs page.  GENERAL: no distress.  Pulses: foot pulses are intact bilaterally.   MSK: no deformity of the feet or ankles.  CV: no edema of the legs or ankles Skin:  no ulcer on the feet or ankles, but there are slight abrasions on the tips of the toes.  normal color and temp on the feet and ankles Neuro: sensation is intact to touch on the feet and ankles, but decreased from normal.   Lab Results  Component Value Date   HGBA1C 6.6 11/05/2016      Assessment & Plan:  Obesity: improved since surgery.  Insulin-requiring type 2 DM, with: overcontrolled, given this regimen, which does match insulin to her changing needs throughout the day: we discussed: she declines parlodel and invokana.   Toe abrasions: poss due to loss of sensation  Patient Instructions  Please carefully walk carefully, to see if that  helps prevent scraping your toes.   Please reduce the lantus to 5 units each morning, then go off the insulin if you can.  Please continue the same other diabetes medications.  check your blood sugar twice a day.  vary the time of day when you check, between before the 3 meals, and at bedtime.  also check if you have symptoms of your blood sugar being too high or too low.  please keep a record of the readings and  bring it to your next appointment here (or you can bring the meter itself).  You can write it on any piece of paper.  please call us sooner if your blood sugar goes below 70, or if you have a lot of readings over 200.   Please come back for a follow-up appointment in 3 months.

## 2016-11-05 NOTE — Patient Instructions (Addendum)
Please carefully walk carefully, to see if that helps prevent scraping your toes.   Please reduce the lantus to 5 units each morning, then go off the insulin if you can.  Please continue the same other diabetes medications.  check your blood sugar twice a day.  vary the time of day when you check, between before the 3 meals, and at bedtime.  also check if you have symptoms of your blood sugar being too high or too low.  please keep a record of the readings and bring it to your next appointment here (or you can bring the meter itself).  You can write it on any piece of paper.  please call us sooner if your blood sugar goes below 70, or if you have a lot of readings over 200.   Please come back for a follow-up appointment in 3 months.

## 2016-11-05 NOTE — Patient Instructions (Addendum)
-   Try using crock pot when cooking meats.  - Try [employer] dressings to help tenderize meats.  - Try to find Premier Protein clear drink.   - Continue to increase physical activity to at least 30-45 min, 5 days/week, including strength-training   - Try [employer] App.

## 2016-11-10 ENCOUNTER — Other Ambulatory Visit: Payer: Self-pay | Admitting: Endocrinology

## 2016-11-10 ENCOUNTER — Other Ambulatory Visit: Payer: Self-pay | Admitting: Physician Assistant

## 2016-11-10 DIAGNOSIS — F419 Anxiety disorder, unspecified: Principal | ICD-10-CM

## 2016-11-10 DIAGNOSIS — F32A Depression, unspecified: Secondary | ICD-10-CM

## 2016-11-10 DIAGNOSIS — F329 Major depressive disorder, single episode, unspecified: Secondary | ICD-10-CM

## 2016-11-11 ENCOUNTER — Ambulatory Visit: Payer: BLUE CROSS/BLUE SHIELD | Admitting: Physician Assistant

## 2016-11-13 ENCOUNTER — Telehealth: Payer: Self-pay | Admitting: Endocrinology

## 2016-11-13 NOTE — Telephone Encounter (Signed)
[employer] rep Huntley Dec in [place] dept, Victoza claim is being submitted incorrectly by pharm It should be 9 per 28 days and pharm I submitting 18 per 28days.   Can Call sara back to clarify. EXT [id number]  Please advise,  Ty,  =LL

## 2016-11-13 NOTE — Telephone Encounter (Signed)
Routing to you °

## 2016-11-16 ENCOUNTER — Other Ambulatory Visit: Payer: Self-pay

## 2016-11-16 NOTE — Telephone Encounter (Signed)
Called Huntley Dec from [facility] as well as [facility] to correct prescription & should no go through now with correct QL.

## 2016-11-18 ENCOUNTER — Ambulatory Visit: Payer: BLUE CROSS/BLUE SHIELD | Admitting: Endocrinology

## 2016-12-25 ENCOUNTER — Other Ambulatory Visit: Payer: Self-pay | Admitting: Physician Assistant

## 2016-12-25 DIAGNOSIS — F32A Depression, unspecified: Secondary | ICD-10-CM

## 2016-12-25 DIAGNOSIS — F419 Anxiety disorder, unspecified: Principal | ICD-10-CM

## 2016-12-25 DIAGNOSIS — F329 Major depressive disorder, single episode, unspecified: Secondary | ICD-10-CM

## 2017-01-02 DIAGNOSIS — Z23 Encounter for immunization: Secondary | ICD-10-CM | POA: Diagnosis not present
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
## 2017-01-26 DIAGNOSIS — B9689 Other specified bacterial agents as the cause of diseases classified elsewhere: Secondary | ICD-10-CM | POA: Diagnosis not present

## 2017-01-26 DIAGNOSIS — L304 Erythema intertrigo: Secondary | ICD-10-CM | POA: Diagnosis not present

## 2017-01-26 DIAGNOSIS — L02821 Furuncle of head [any part, except face]: Secondary | ICD-10-CM | POA: Diagnosis not present

## 2017-02-04 ENCOUNTER — Ambulatory Visit: Payer: BLUE CROSS/BLUE SHIELD | Admitting: Registered"

## 2017-02-04 ENCOUNTER — Ambulatory Visit: Payer: BLUE CROSS/BLUE SHIELD | Admitting: Endocrinology

## 2017-02-18 ENCOUNTER — Other Ambulatory Visit: Payer: Self-pay | Admitting: Endocrinology

## 2017-03-01 ENCOUNTER — Telehealth (INDEPENDENT_AMBULATORY_CARE_PROVIDER_SITE_OTHER): Payer: Self-pay | Admitting: Orthopedic Surgery

## 2017-03-01 NOTE — Telephone Encounter (Signed)
Called patient call disconnected  Could not leave message [phone]

## 2017-03-08 ENCOUNTER — Ambulatory Visit: Payer: BLUE CROSS/BLUE SHIELD | Admitting: Physician Assistant

## 2017-03-08 VITALS — BP 133/80 | HR 80 | Temp 98.9°F | Resp 16 | Ht 66.0 in | Wt 230.0 lb

## 2017-03-08 DIAGNOSIS — F411 Generalized anxiety disorder: Secondary | ICD-10-CM | POA: Diagnosis not present

## 2017-03-08 MED ORDER — BUSPIRONE HCL 7.5 MG PO TABS: 7.5000 mg | ORAL_TABLET | Freq: Two times a day (BID) | ORAL | 1 refills | Status: DC

## 2017-03-08 NOTE — Patient Instructions (Signed)
Buspirone tablets What is this medicine? BUSPIRONE (byoo SPYE rone) is used to treat anxiety disorders. This medicine may be used for other purposes; ask your health care provider or pharmacist if you have questions. COMMON BRAND NAME(S): BuSpar What should I tell my health care provider before I take this medicine? They need to know if you have any of these conditions: -kidney or liver disease -an unusual or allergic reaction to buspirone, other medicines, foods, dyes, or preservatives -pregnant or trying to get pregnant -breast-feeding How should I use this medicine? Take this medicine by mouth with a glass of water. Follow the directions on the prescription label. You may take this medicine with or without food. To ensure that this medicine always works the same way for you, you should take it either always with or always without food. Take your doses at regular intervals. Do not take your medicine more often than directed. Do not stop taking except on the advice of your doctor or health care professional. Talk to your pediatrician regarding the use of this medicine in children. Special care may be needed. Overdosage: If you think you have taken too much of this medicine contact a poison control center or emergency room at once. NOTE: This medicine is only for you. Do not share this medicine with others. What if I miss a dose? If you miss a dose, take it as soon as you can. If it is almost time for your next dose, take only that dose. Do not take double or extra doses. What may interact with this medicine? Do not take this medicine with any of the following medications: -linezolid -MAOIs like Carbex, Eldepryl, Marplan, Nardil, and Parnate -methylene blue -procarbazine This medicine may also interact with the following medications: -diazepam -digoxin -diltiazem -erythromycin -grapefruit juice -haloperidol -medicines for mental depression or mood problems -medicines for seizures like  carbamazepine, phenobarbital and phenytoin -nefazodone -other medications for anxiety -rifampin -ritonavir -some antifungal medicines like itraconazole, ketoconazole, and voriconazole -verapamil -warfarin This list may not describe all possible interactions. Give your health care provider a list of all the medicines, herbs, non-prescription drugs, or dietary supplements you use. Also tell them if you smoke, drink alcohol, or use illegal drugs. Some items may interact with your medicine. What should I watch for while using this medicine? Visit your doctor or health care professional for regular checks on your progress. It may take 1 to 2 weeks before your anxiety gets better. You may get drowsy or dizzy. Do not drive, use machinery, or do anything that needs mental alertness until you know how this drug affects you. Do not stand or sit up quickly, especially if you are an older patient. This reduces the risk of dizzy or fainting spells. Alcohol can make you more drowsy and dizzy. Avoid alcoholic drinks. What side effects may I notice from receiving this medicine? Side effects that you should report to your doctor or health care professional as soon as possible: -blurred vision or other vision changes -chest pain -confusion -difficulty breathing -feelings of hostility or anger -muscle aches and pains -numbness or tingling in hands or feet -ringing in the ears -skin rash and itching -vomiting -weakness Side effects that usually do not require medical attention (report to your doctor or health care professional if they continue or are bothersome): -disturbed dreams, nightmares -headache -nausea -restlessness or nervousness -sore throat and nasal congestion -stomach upset This list may not describe all possible side effects. Call your doctor for medical advice about side   effects. You may report side effects to FDA at 1-800-FDA-1088. Where should I keep my medicine? Keep out of the reach  of children. Store at room temperature below 30 degrees C (86 degrees F). Protect from light. Keep container tightly closed. Throw away any unused medicine after the expiration date. NOTE: This sheet is a summary. It may not cover all possible information. If you have questions about this medicine, talk to your doctor, pharmacist, or health care provider.  2018 Elsevier/Gold Standard (2009-09-19 18:06:11)  
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
## 2017-03-30 ENCOUNTER — Encounter: Payer: Self-pay | Admitting: Physician Assistant

## 2017-03-31 ENCOUNTER — Ambulatory Visit: Payer: BLUE CROSS/BLUE SHIELD | Admitting: Physician Assistant

## 2017-03-31 ENCOUNTER — Encounter: Payer: Self-pay | Admitting: Physician Assistant

## 2017-03-31 ENCOUNTER — Other Ambulatory Visit: Payer: Self-pay

## 2017-03-31 VITALS — BP 160/78 | HR 97 | Temp 98.5°F | Resp 18 | Ht 65.63 in | Wt 230.0 lb

## 2017-03-31 DIAGNOSIS — F329 Major depressive disorder, single episode, unspecified: Secondary | ICD-10-CM | POA: Diagnosis not present

## 2017-03-31 DIAGNOSIS — F32A Depression, unspecified: Secondary | ICD-10-CM

## 2017-03-31 MED ORDER — BUSPIRONE HCL 15 MG PO TABS: 15.0000 mg | ORAL_TABLET | Freq: Two times a day (BID) | ORAL | 1 refills | Status: DC

## 2017-03-31 NOTE — Progress Notes (Signed)
PRIMARY CARE AT [facility] [address], [place] [address] [phone]  Date:  03/31/2017   Name:  Shelby Scott   DOB:  11-03-1965   MRN:  [id number]  PCP:  Patient, No Pcp Per    History of Present Illness:  Shelby Scott is a 52 y.o. female patient who presents to PCP with  Chief Complaint  Patient presents with  . Depression    screening done  . Anxiety    screening done    Patient is here for follow up of her depression and anxiety.  She has been on the effexor for several years.  She was seen 3 weeks ago for management of her anxiety.  Placed on buspar at that time and advised to return in 3 weeks for recheck.  She notes that she has not seen much of a change.  She feels that she is very depressed and endorses anhedonia.  She feels very detached from her work and co-workers.  She has family stressors with her mother.  Finds her mother to be very domineering.  She denies SI/HI plans.  She can become very angry with her mother.  Denies any aggressive physical behavior.  Brother--bipolar Uncle maternal--bipolar Maternal Grandfather--bipolar Mother--bipolar Father --adhd   Patient Active Problem List   Diagnosis Date Noted  . Acute allergic reaction 06/15/2016  . 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 S/P gastric bypass 06/08/2016  . Essential hypertension 05/06/2016  . Type 2 diabetes mellitus with diabetic neuropathy (HCC) 10/08/2014    Past Medical History:  Diagnosis Date  . Anemia   . Anxiety   . Asthma   . Depression   . Diabetes mellitus without complication (HCC)   . Hypertension     Past Surgical History:  Procedure Laterality Date  . CHOLECYSTECTOMY    . GASTRIC ROUX-EN-Y N/A 06/08/2016   Procedure: LAPAROSCOPIC ROUX-EN-Y GASTRIC BYPASS WITH UPPER ENDOSCOPY;  Surgeon: Luretha Murphy, MD;  Location: [facility];  Service: General;  Laterality: N/A;  . UPPER GI ENDOSCOPY  06/08/2016   Procedure: UPPER GI ENDOSCOPY;  Surgeon: Luretha Murphy, MD;   Location: [facility];  Service: General;;    Social History   Tobacco Use  . Smoking status: Never Smoker  . Smokeless tobacco: Never Used  Substance Use Topics  . Alcohol use: No    Alcohol/week: 0.0 oz  . Drug use: No    Family History  Problem Relation Age of Onset  . Hypertension Father   . Diabetes Maternal Grandmother   . Mental illness Maternal Grandfather   . Diabetes Paternal Grandmother   . Hypertension Paternal Grandmother   . Heart disease Paternal Grandfather   . Diabetes Brother   . Mental illness Brother     Allergies  Allergen Reactions  . Ibuprofen Other (See Comments)  . Lisinopril Cough    Medication list has been reviewed and updated.  Current Outpatient Medications on File Prior to Visit  Medication Sig Dispense Refill  . ACCU-CHEK SMARTVIEW test strip USE TWICE DAILY 100 each 11  . ACETYLCARN-ALPHA LIPOIC ACID PO Take 1 tablet by mouth 2 (two) times daily.    . Adapalene-Benzoyl Peroxide (EPIDUO EX) Apply 1 application topically at bedtime.    . Calcium Citrate-Vitamin D (CALCIUM CITRATE CHEWY BITE PO) Take by mouth. Take 3 times daily    . CIPRODEX OTIC suspension   0  . cyclobenzaprine (FLEXERIL) 10 MG tablet Take 0.5-1 tablets (5-10 mg total) by mouth 3 (three)  times daily as needed. 30 tablet 0  . fluticasone (FLONASE) 50 MCG/ACT nasal spray Place 2 sprays into both nostrils daily. 16 g 2  . gabapentin (NEURONTIN) 300 MG capsule TAKE 1 CAPSULE BY MOUTH THREE TIMES DAILY 30 capsule 0  . Insulin Glargine (LANTUS SOLOSTAR) 100 UNIT/ML Solostar Pen Inject 5 Units into the skin every morning. 5 pen 11  . metFORMIN (GLUCOPHAGE-XR) 500 MG 24 hr tablet Take 4 tablets (2,000 mg total) by mouth daily with breakfast. 120 tablet 11  . Multiple Vitamin (MULTIVITAMIN WITH MINERALS) TABS tablet Take 1 tablet by mouth at bedtime.    . Multiple Vitamins-Minerals (CELEBRATE MULTI-COMPLETE 18) CHEW Chew by mouth. Take 2 daily    . mupirocin ointment (BACTROBAN) 2 %  Apply 1 application topically 3 (three) times daily. 22 g 1  . ondansetron (ZOFRAN-ODT) 4 MG disintegrating tablet Take 4 mg by mouth every 6 (six) hours as needed for nausea.  0  . pantoprazole (PROTONIX) 40 MG tablet Take 40 mg by mouth daily.  2  . Polyethyl Glycol-Propyl Glycol (SYSTANE) 0.4-0.3 % GEL ophthalmic gel Place 1 application into both eyes 2 (two) times daily as needed (for dry/irritated eyes).    . polyethylene glycol powder (GLYCOLAX/MIRALAX) powder Mix 17 gms in 8 oz of H2O bid for 5-7 days 289 g 0  . PROAIR HFA 108 (90 Base) MCG/ACT inhaler INHALE 2 PUFFS INTO THE LUNGS EVERY 6 HOURS AS NEEDED FOR WHEEZING OR SHORTNESS OF BREATH 8.5 g 0  . traMADol (ULTRAM) 50 MG tablet Take 1 tablet (50 mg total) by mouth every 8 (eight) hours as needed. 21 tablet 0  . venlafaxine XR (EFFEXOR XR) 37.5 MG 24 hr capsule Take 1 capsule (37.5 mg total) by mouth daily with breakfast. 90 capsule 1  . VICTOZA 18 MG/3ML SOPN INJECT 1.8 MG UNDER THE SKIN DAILY AS DIRECTED 18 mL 0   No current facility-administered medications on file prior to visit.     ROS ROS otherwise unremarkable unless listed above.  Physical Examination: BP (!) 160/78 (BP Location: Right Arm, Patient Position: Sitting, Cuff Size: Large)   Pulse 97   Temp 98.5 F (36.9 C) (Oral)   Resp 18   Ht 5' 5.63" (1.667 m)   Wt 230 lb (104.3 kg)   SpO2 96%   BMI 37.54 kg/m  Ideal Body Weight: Weight in (lb) to have BMI = 25: 152.8  Physical Exam  Constitutional: She is oriented to person, place, and time. She appears well-developed and well-nourished. No distress.  HENT:  Head: Normocephalic and atraumatic.  Right Ear: External ear normal.  Left Ear: External ear normal.  Eyes: Conjunctivae and EOM are normal. Pupils are equal, round, and reactive to light.  Cardiovascular: Normal rate and regular rhythm. Exam reveals no friction rub.  No murmur heard. Pulmonary/Chest: Effort normal. No respiratory distress.  Neurological:  She is alert and oriented to person, place, and time.  Skin: She is not diaphoretic.  Psychiatric: She has a normal mood and affect. Her behavior is normal.     Assessment and Plan: Shelby Scott is a 52 y.o. female who is here today for cc of  Chief Complaint  Patient presents with  . Depression    screening done  . Anxiety    screening done  referring to psychiatrist at this time for consult. Depression, unspecified depression type - Plan: busPIRone (BUSPAR) 15 MG tablet, Ambulatory referral to Psychiatry  Trena Platt, PA-C [facility] 2/22/20199:01 AM

## 2017-03-31 NOTE — Patient Instructions (Addendum)
I am referring you to a psychiatrist.  Please await this contact.  [facility] [address] [place], [place] [address]   Shanon RosserBarbara Farran [phone]  Maris BergerKathy Kirstner [phone]  Marco CollieSusan Kroll-Smith [phone]  [facility] ([address]Beth Hulan [place]mato[place]incaid, Ernest Beckey [place], Heather Joycelyn SchmidKitchens, Karla [place]) - [phone]  [facility](Julie OnyxWhitt) - [phone]  [facility] - [phone]  [facility] - [phone]  Buena IrishBob Mylan - [phone]  [facility]  - [phone] (do not file insurance)     IF you received an x-ray today, you will receive an invoice from [facility]. Please contact [facility] at [phone] with questions or concerns regarding your invoice.   IF you received labwork today, you will receive an invoice from [facility]. Please contact [facility] at [phone] with questions or concerns regarding your invoice.   Our billing staff will not be able to assist you with questions regarding bills from these companies.  You will be contacted with the lab results as soon as they are available. The fastest way to get your results is to activate your My Chart account. Instructions are located on the last page of this paperwork. If you have not heard from us regarding the results in 2 weeks, please contact this office.

## 2017-04-01 ENCOUNTER — Ambulatory Visit: Payer: BLUE CROSS/BLUE SHIELD | Admitting: Physician Assistant

## 2017-04-04 ENCOUNTER — Other Ambulatory Visit: Payer: Self-pay | Admitting: Family Medicine

## 2017-04-04 DIAGNOSIS — F329 Major depressive disorder, single episode, unspecified: Secondary | ICD-10-CM

## 2017-04-04 DIAGNOSIS — F32A Depression, unspecified: Secondary | ICD-10-CM

## 2017-04-04 DIAGNOSIS — F419 Anxiety disorder, unspecified: Principal | ICD-10-CM

## 2017-04-05 NOTE — Telephone Encounter (Signed)
Patient seen by PA English, please advise, Thank you

## 2017-04-06 NOTE — Telephone Encounter (Signed)
[facility]

## 2017-05-06 DIAGNOSIS — H35363 Drusen (degenerative) of macula, bilateral: Secondary | ICD-10-CM | POA: Diagnosis not present

## 2017-05-06 DIAGNOSIS — H04123 Dry eye syndrome of bilateral lacrimal glands: Secondary | ICD-10-CM | POA: Diagnosis not present

## 2017-05-06 DIAGNOSIS — E113293 Type 2 diabetes mellitus with mild nonproliferative diabetic retinopathy without macular edema, bilateral: Secondary | ICD-10-CM | POA: Diagnosis not present

## 2017-05-06 DIAGNOSIS — M21611 Bunion of right foot: Secondary | ICD-10-CM | POA: Diagnosis not present

## 2017-05-06 DIAGNOSIS — M21612 Bunion of left foot: Secondary | ICD-10-CM | POA: Diagnosis not present

## 2017-05-06 DIAGNOSIS — H43823 Vitreomacular adhesion, bilateral: Secondary | ICD-10-CM | POA: Diagnosis not present

## 2017-05-25 ENCOUNTER — Encounter: Payer: Self-pay | Admitting: Urgent Care

## 2017-05-25 ENCOUNTER — Ambulatory Visit: Payer: BLUE CROSS/BLUE SHIELD | Admitting: Urgent Care

## 2017-05-25 ENCOUNTER — Encounter: Payer: Self-pay | Admitting: Physician Assistant

## 2017-05-25 VITALS — BP 131/78 | HR 96 | Temp 98.2°F | Resp 17 | Ht 65.5 in | Wt 232.0 lb

## 2017-05-25 DIAGNOSIS — R1013 Epigastric pain: Secondary | ICD-10-CM | POA: Diagnosis not present

## 2017-05-25 DIAGNOSIS — Z9884 Bariatric surgery status: Secondary | ICD-10-CM | POA: Diagnosis not present

## 2017-05-25 DIAGNOSIS — R110 Nausea: Secondary | ICD-10-CM

## 2017-05-25 DIAGNOSIS — Z9049 Acquired absence of other specified parts of digestive tract: Secondary | ICD-10-CM

## 2017-05-25 DIAGNOSIS — R1010 Upper abdominal pain, unspecified: Secondary | ICD-10-CM | POA: Diagnosis not present

## 2017-05-25 DIAGNOSIS — R7989 Other specified abnormal findings of blood chemistry: Secondary | ICD-10-CM

## 2017-05-25 MED ORDER — ONDANSETRON 4 MG PO TBDP: 4.0000 mg | ORAL_TABLET | Freq: Three times a day (TID) | ORAL | 0 refills | Status: DC | PRN

## 2017-05-25 NOTE — Progress Notes (Signed)
    MRN: [id number] DOB: [birth year]  Subjective:   Shelby Scott is a 52 y.o. female presenting for several week history of constant right-sided to epigastric pain, nausea at night. Pain is typically worse after eating. Has tried APAP, Pepto, Gas-X. 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 Has not had close follow up with her gastric bypass surgeon. Denies fever, bloody stools, vomiting. Has a history of IBS, constipation type. Has had normal stools lately. Last bowel movement was this morning. Has not discussed her symptoms with her GI doctor. Blood sugar is at ~120's-150's. She is not taking long acting insulin every day. Denies smoking cigarettes. Admits that she has been doing some heavy lifting recently, straining during that strenuous activity.   Shelby Scott has a current medication list which includes the following prescription(s): accu-chek smartview, acetylcarn-alpha lipoic acid, adapalene-benzoyl peroxide, buspirone, ciprodex, fluticasone, metformin, mupirocin ointment, polyethyl glycol-propyl glycol, proair hfa, tramadol, venlafaxine xr, venlafaxine xr, and victoza. Also is allergic to ibuprofen and lisinopril.  Shelby Scott  has a past medical history of Anemia, Anxiety, Asthma, Depression, Diabetes mellitus without complication (HCC), and Hypertension. Also  has a past surgical history that includes Cholecystectomy; Gastric Roux-En-Y (N/A, 06/08/2016); and Upper gi endoscopy (06/08/2016).  Objective:   Vitals: BP 131/78   Pulse 96   Temp 98.2 F (36.8 C) (Oral)   Resp 17   Ht 5' 5.5" (1.664 m)   Wt 232 lb (105.2 kg)   SpO2 98%   BMI 38.02 kg/m   Physical Exam  Constitutional: She is oriented to person, place, and time. She appears well-developed and well-nourished.  HENT:  Mouth/Throat: Oropharynx is clear and moist.  Eyes: No scleral icterus.  Cardiovascular: Normal rate, regular rhythm and intact distal pulses. Exam reveals no gallop and no friction rub.  No  murmur heard. Pulmonary/Chest: No respiratory distress. She has no wheezes. She has no rales.  Abdominal: Soft. Bowel sounds are normal. She exhibits no distension and no mass. There is no hepatosplenomegaly. There is tenderness (by report only, not elicited on exam) in the right upper quadrant and epigastric area. There is no rigidity, no rebound, no guarding, no CVA tenderness and negative Murphy's sign. No hernia.  Musculoskeletal: She exhibits no edema.  Neurological: She is alert and oriented to person, place, and time.  Skin: Skin is warm and dry. No rash noted. No erythema. No pallor.  Psychiatric: She has a normal mood and affect.   Assessment and Plan :   Upper abdominal pain - Plan: Comprehensive metabolic panel, CBC  Epigastric pain - Plan: Comprehensive metabolic panel, CBC  Nausea without vomiting  Personal history of gastric bypass  History of cholecystectomy  Physical exam findings reassuring, will start patient on Zofran. Recommended adequate hydration, rest from strenuous activity. Labs pending. Recommended patient contact her gastric bypass surgeon for follow up and recommendations. Patient may need imaging but I will defer to her GI surgeon for this. Return-to-clinic precautions discussed, patient verbalized understanding.   Wallis BambergMario Treena Cosman, PA-C Primary Care at [facility] [phone] 05/25/2017  3:07 PM

## 2017-05-25 NOTE — Patient Instructions (Addendum)
Hydrate well with at least 2 liters (1 gallon) of water daily. Please make sure you schedule a follow up with your surgeon that did your gastric bypass to check into your belly pain that may be coming from your surgery.     Abdominal Pain, Adult Abdominal pain can be caused by many things. Often, abdominal pain is not serious and it gets better with no treatment or by being treated at home. However, sometimes abdominal pain is serious. Your health care provider will do a medical history and a physical exam to try to determine the cause of your abdominal pain. Follow these instructions at home:  Take over-the-counter and prescription medicines only as told by your health care provider. Do not take a laxative unless told by your health care provider.  Drink enough fluid to keep your urine clear or pale yellow.  Watch your condition for any changes.  Keep all follow-up visits as told by your health care provider. This is important. Contact a health care provider if:  Your abdominal pain changes or gets worse.  You are not hungry or you lose weight without trying.  You are constipated or have diarrhea for more than 2-3 days.  You have pain when you urinate or have a bowel movement.  Your abdominal pain wakes you up at night.  Your pain gets worse with meals, after eating, or with certain foods.  You are throwing up and cannot keep anything down.  You have a fever. Get help right away if:  Your pain does not go away as soon as your health care provider told you to expect.  You cannot stop throwing up.  Your pain is only in areas of the abdomen, such as the right side or the left lower portion of the abdomen.  You have bloody or black stools, or stools that look like tar.  You have severe pain, cramping, or bloating in your abdomen.  You have signs of dehydration, such as: ? Dark urine, very little urine, or no urine. ? Cracked lips. ? Dry mouth. ? Sunken  eyes. ? Sleepiness. ? Weakness. This information is not intended to replace advice given to you by your health care provider. Make sure you discuss any questions you have with your health care provider. Document Released: 11/19/2004 Document Revised: 08/30/2015 Document Reviewed: 07/24/2015 Elsevier Interactive Patient Education  2018 [employer]     IF you received an x-ray today, you will receive an invoice from [facility]. Please contact [facility] at [phone] with questions or concerns regarding your invoice.   IF you received labwork today, you will receive an invoice from [facility]. Please contact [facility] at [phone] with questions or concerns regarding your invoice.   Our billing staff will not be able to assist you with questions regarding bills from these companies.  You will be contacted with the lab results as soon as they are available. The fastest way to get your results is to activate your My Chart account. Instructions are located on the last page of this paperwork. If you have not heard from us regarding the results in 2 weeks, please contact this office.

## 2017-05-26 LAB — COMPREHENSIVE METABOLIC PANEL
ALBUMIN: 4.4 g/dL (ref 3.5–5.5)
ALK PHOS: 145 IU/L — AB (ref 39–117)
ALT: 16 IU/L (ref 0–32)
AST: 18 IU/L (ref 0–40)
Albumin/Globulin Ratio: 1.4 (ref 1.2–2.2)
BUN / CREAT RATIO: 21 (ref 9–23)
BUN: 15 mg/dL (ref 6–24)
CHLORIDE: 100 mmol/L (ref 96–106)
CO2: 21 mmol/L (ref 20–29)
Calcium: 9.6 mg/dL (ref 8.7–10.2)
Creatinine, Ser: 0.7 mg/dL (ref 0.57–1.00)
GFR calc Af Amer: 115 mL/min/{1.73_m2} (ref >59)
GFR calc non Af Amer: 100 mL/min/{1.73_m2} (ref >59)
GLOBULIN, TOTAL: 3.1 g/dL (ref 1.5–4.5)
Glucose: 152 mg/dL — ABNORMAL HIGH (ref 65–99)
Potassium: 4.1 mmol/L (ref 3.5–5.2)
SODIUM: 141 mmol/L (ref 134–144)
Total Protein: 7.5 g/dL (ref 6.0–8.5)

## 2017-05-26 LAB — CBC
HEMATOCRIT: 36.8 % (ref 34.0–46.6)
HEMOGLOBIN: 12.2 g/dL (ref 11.1–15.9)
MCH: 27.5 pg (ref 26.6–33.0)
MCHC: 33.2 g/dL (ref 31.5–35.7)
MCV: 83 fL (ref 79–97)
Platelets: 380 10*3/uL — ABNORMAL HIGH (ref 150–379)
RBC: 4.43 x10E6/uL (ref 3.77–5.28)
RDW: 14.9 % (ref 12.3–15.4)
WBC: 10 10*3/uL (ref 3.4–10.8)

## 2017-05-31 ENCOUNTER — Other Ambulatory Visit: Payer: Self-pay | Admitting: Physician Assistant

## 2017-05-31 DIAGNOSIS — F32A Depression, unspecified: Secondary | ICD-10-CM

## 2017-05-31 DIAGNOSIS — F329 Major depressive disorder, single episode, unspecified: Secondary | ICD-10-CM

## 2017-05-31 DIAGNOSIS — R7989 Other specified abnormal findings of blood chemistry: Secondary | ICD-10-CM | POA: Diagnosis not present

## 2017-05-31 LAB — HEMOGLOBIN A1C
ESTIMATED AVERAGE GLUCOSE: 177 mg/dL
Hgb A1c MFr Bld: 7.8 % — ABNORMAL HIGH (ref 4.8–5.6)

## 2017-05-31 NOTE — Addendum Note (Signed)
Addended by: Baldwin CrownJOHNSON, SHAQUETTA D on: 05/31/2017 09:03 AM   Modules accepted: Orders

## 2017-06-03 DIAGNOSIS — E1140 Type 2 diabetes mellitus with diabetic neuropathy, unspecified: Secondary | ICD-10-CM | POA: Diagnosis not present

## 2017-06-03 DIAGNOSIS — M21611 Bunion of right foot: Secondary | ICD-10-CM | POA: Diagnosis not present

## 2017-06-03 DIAGNOSIS — M21612 Bunion of left foot: Secondary | ICD-10-CM | POA: Diagnosis not present

## 2017-06-22 DIAGNOSIS — E119 Type 2 diabetes mellitus without complications: Secondary | ICD-10-CM | POA: Diagnosis not present

## 2017-06-30 ENCOUNTER — Encounter: Payer: Self-pay | Admitting: Family Medicine

## 2017-06-30 ENCOUNTER — Other Ambulatory Visit: Payer: Self-pay

## 2017-06-30 ENCOUNTER — Ambulatory Visit: Payer: BLUE CROSS/BLUE SHIELD | Admitting: Family Medicine

## 2017-06-30 VITALS — BP 116/70 | HR 106 | Temp 99.1°F | Resp 18 | Ht 65.5 in | Wt 229.2 lb

## 2017-06-30 DIAGNOSIS — Z9109 Other allergy status, other than to drugs and biological substances: Secondary | ICD-10-CM | POA: Diagnosis not present

## 2017-06-30 DIAGNOSIS — J4521 Mild intermittent asthma with (acute) exacerbation: Secondary | ICD-10-CM | POA: Diagnosis not present

## 2017-06-30 MED ORDER — MONTELUKAST SODIUM 10 MG PO TABS: 10.0000 mg | ORAL_TABLET | Freq: Every day | ORAL | 3 refills | Status: DC

## 2017-06-30 MED ORDER — ALBUTEROL SULFATE HFA 108 (90 BASE) MCG/ACT IN AERS: INHALATION_SPRAY | RESPIRATORY_TRACT | 1 refills | Status: DC

## 2017-06-30 MED ORDER — FLUTICASONE PROPIONATE HFA 44 MCG/ACT IN AERO: 1.0000 | INHALATION_SPRAY | Freq: Two times a day (BID) | RESPIRATORY_TRACT | 12 refills | Status: DC

## 2017-06-30 NOTE — Progress Notes (Signed)
5/8/20194:24 PM  Shelby Scott Dec 16, 1965, 52 y.o. female [id number]  Chief Complaint  Patient presents with  . Asthma    having trouble at work with asthma     HPI:   Patient is a 52 y.o. female who presents today for problems with her asthma  She states that for the past several weeks she has been having a dry cough, worse towards the afternoon These symptoms only happen when she is at work, has no issues at home  known asthma triggers are stress, dust and fragrances She is starting to get hoarse from the coughing She has been having chest tightness and wheezing for which she uses her albuterol. She is not on ICS She reports her last asthma exacerbation about 3 years ago She has never been hosp for her asthma as an adult She does not smoke She reports chills at work She denies URI sx She reports itchy eyes, occ sneezing and itchy throat She also reports ringing of ears with sense of disequilibrium. She denies fullness/pressure  She is also having issues with her anxiety but she sees psych next week  Fall Risk  05/25/2017 03/31/2017 11/05/2016 08/08/2016 08/04/2016  Falls in the past year? No No No No No     Depression screen [facility] 2/9 05/25/2017 03/31/2017 11/05/2016  Decreased Interest 0 2 0  Down, Depressed, Hopeless 0 3 0  PHQ - 2 Score 0 5 0  Altered sleeping - 0 -  Tired, decreased energy - 1 -  Change in appetite - 1 -  Feeling bad or failure about yourself  - 1 -  Trouble concentrating - 1 -  Moving slowly or fidgety/restless - 0 -  Suicidal thoughts - 1 -  PHQ-9 Score - 10 -  Difficult doing work/chores - Somewhat difficult -   GAD 7 : Generalized Anxiety Score 03/31/2017 10/03/2016  Nervous, Anxious, on Edge 1 3  Control/stop worrying 3 3  Worry too much - different things 1 3  Trouble relaxing 1 2  Restless 1 1  Easily annoyed or irritable 3 3  Afraid - awful might happen 1 3  Total GAD 7 Score 11 18  Anxiety Difficulty Somewhat difficult -     Allergies    Allergen Reactions  . Ibuprofen Other (See Comments)  . Lisinopril Cough    Prior to Admission medications   Medication Sig Start Date End Date Taking? Authorizing Provider  ACCU-CHEK SMARTVIEW test strip USE TWICE DAILY 09/08/16  Yes Romero Belling, MD  ACETYLCARN-ALPHA LIPOIC ACID PO Take 1 tablet by mouth 2 (two) times daily.   Yes [provider]  Adapalene-Benzoyl Peroxide (EPIDUO EX) Apply 1 application topically at bedtime.   Yes [provider]  busPIRone (BUSPAR) 15 MG tablet TAKE 1 TABLET(15 MG) BY MOUTH TWICE DAILY 05/31/17  Yes English, [place] D, [place]  CIPRODEX OTIC suspension  08/28/16  Yes [provider]  fluticasone (FLONASE) 50 MCG/ACT nasal spray Place 2 sprays into both nostrils daily. 06/06/16  Yes Peyton Najjar, MD  metFORMIN (GLUCOPHAGE-XR) 500 MG 24 hr tablet Take 4 tablets (2,000 mg total) by mouth daily with breakfast. 07/30/16  Yes Romero Belling, MD  mupirocin ointment (BACTROBAN) 2 % Apply 1 application topically 3 (three) times daily. 12/26/15  Yes English, Judeth Cornfield D, PA  ondansetron (ZOFRAN-ODT) 4 MG disintegrating tablet Take 1 tablet (4 mg total) by mouth every 8 (eight) hours as needed for nausea or vomiting. 05/25/17  Yes Wallis Bamberg, PA-C  Polyethyl Glycol-Propyl  Glycol (SYSTANE) 0.4-0.3 % GEL ophthalmic gel Place 1 application into both eyes 2 (two) times daily as needed (for dry/irritated eyes).   Yes [provider]  PROAIR HFA 108 (90 Base) MCG/ACT inhaler INHALE 2 PUFFS INTO THE LUNGS EVERY 6 HOURS AS NEEDED FOR WHEEZING OR SHORTNESS OF BREATH 11/20/15  Yes English, Stephanie D, PA  traMADol (ULTRAM) 50 MG tablet Take 1 tablet (50 mg total) by mouth every 8 (eight) hours as needed. 10/31/16  Yes Trena Platt D, PA  venlafaxine XR (EFFEXOR XR) 37.5 MG 24 hr capsule Take 1 capsule (37.5 mg total) by mouth daily with breakfast. 10/03/16  Yes Shade Flood, MD  venlafaxine XR (EFFEXOR-XR) 150 MG 24 hr capsule TAKE 1  CAPSULE(150 MG) BY MOUTH DAILY WITH BREAKFAST 04/06/17  Yes Shade Flood, MD  VICTOZA 18 MG/3ML SOPN INJECT 1.8 MG UNDER THE SKIN DAILY AS DIRECTED 02/19/17  Yes Romero Belling, MD    Past Medical History:  Diagnosis Date  . Anemia   . Anxiety   . Asthma   . Depression   . Diabetes mellitus without complication (HCC)   . Hypertension     Past Surgical History:  Procedure Laterality Date  . CHOLECYSTECTOMY    . GASTRIC ROUX-EN-Y N/A 06/08/2016   Procedure: LAPAROSCOPIC ROUX-EN-Y GASTRIC BYPASS WITH UPPER ENDOSCOPY;  Surgeon: Luretha Murphy, MD;  Location: [facility];  Service: General;  Laterality: N/A;  . UPPER GI ENDOSCOPY  06/08/2016   Procedure: UPPER GI ENDOSCOPY;  Surgeon: Luretha Murphy, MD;  Location: [facility];  Service: General;;    Social History   Tobacco Use  . Smoking status: Never Smoker  . Smokeless tobacco: Never Used  Substance Use Topics  . Alcohol use: No    Alcohol/week: 0.0 oz    Family History  Problem Relation Age of Onset  . Hypertension Father   . Diabetes Maternal Grandmother   . Mental illness Maternal Grandfather   . Diabetes Paternal Grandmother   . Hypertension Paternal Grandmother   . Heart disease Paternal Grandfather   . Diabetes Brother   . Mental illness Brother     ROS Per hpi  OBJECTIVE:  Blood pressure 116/70, pulse (!) 106, temperature 99.1 F (37.3 C), temperature source Oral, resp. rate 18, height 5' 5.5" (1.664 m), weight 229 lb 3.2 oz (104 kg), SpO2 98 %.  Physical Exam  Constitutional: She is oriented to person, place, and time. She appears well-developed and well-nourished.  HENT:  Head: Normocephalic and atraumatic.  Right Ear: Hearing, tympanic membrane, external ear and ear canal normal.  Left Ear: Hearing, tympanic membrane, external ear and ear canal normal.  Mouth/Throat: Oropharynx is clear and moist.  Eyes: Pupils are equal, round, and reactive to light. Conjunctivae and EOM are normal.  Neck: Neck supple.    Cardiovascular: Normal rate, regular rhythm and normal heart sounds. Exam reveals no gallop and no friction rub.  No murmur heard. Pulmonary/Chest: Effort normal and breath sounds normal. She has no wheezes. She has no rales.  Lymphadenopathy:    She has no cervical adenopathy.  Neurological: She is alert and oriented to person, place, and time.  Skin: Skin is warm and dry.  Psychiatric: She has a normal mood and affect.  Nursing note and vitals reviewed.  Peak flow reading is 450, about 105 % of predicted.  ASSESSMENT and PLAN  1. Environmental allergies 2. Mild intermittent asthma with acute exacerbation Patient uncontrolled asthma. However in clinic with normal exam and resp  effort. Starting ICS and adding singulair as she does endorse allergy symptoms. Discussed new med r/se/b. Discussed RTC precautions.   Other orders - Check Peak Flow - montelukast (SINGULAIR) 10 MG tablet; Take 1 tablet (10 mg total) by mouth at bedtime. - fluticasone (FLOVENT HFA) 44 MCG/ACT inhaler; Inhale 1 puff into the lungs 2 (two) times daily. - albuterol (PROAIR HFA) 108 (90 Base) MCG/ACT inhaler; INHALE 2 PUFFS INTO THE LUNGS EVERY 6 HOURS AS NEEDED FOR WHEEZING OR SHORTNESS OF BREATH  Return in about 2 weeks (around 07/14/2017).    Myles Lipps, MD Primary Care at [facility] [address] [place], [place] [address] Ph.  [phone] Fax [phone]

## 2017-06-30 NOTE — Patient Instructions (Signed)
     IF you received an x-ray today, you will receive an invoice from [facility]. Please contact [facility] at [phone] with questions or concerns regarding your invoice.   IF you received labwork today, you will receive an invoice from [facility]. Please contact [facility] at [phone] with questions or concerns regarding your invoice.   Our billing staff will not be able to assist you with questions regarding bills from these companies.  You will be contacted with the lab results as soon as they are available. The fastest way to get your results is to activate your My Chart account. Instructions are located on the last page of this paperwork. If you have not heard from us regarding the results in 2 weeks, please contact this office.     

## 2017-07-06 ENCOUNTER — Other Ambulatory Visit: Payer: Self-pay | Admitting: Family Medicine

## 2017-07-06 DIAGNOSIS — F32A Depression, unspecified: Secondary | ICD-10-CM

## 2017-07-06 DIAGNOSIS — F419 Anxiety disorder, unspecified: Principal | ICD-10-CM

## 2017-07-06 DIAGNOSIS — F329 Major depressive disorder, single episode, unspecified: Secondary | ICD-10-CM

## 2017-07-06 DIAGNOSIS — F418 Other specified anxiety disorders: Secondary | ICD-10-CM

## 2017-07-06 NOTE — Telephone Encounter (Signed)
Effexor Last OV:10/03/16 Last refill:04/06/17 90 tab/0 refill ZOX:[id number] Pharmacy: [id number] - [place], [place] - [address] AT [facility] [address] [phone] (Phone) [phone] (Fax)

## 2017-07-06 NOTE — Telephone Encounter (Signed)
Effexor 37.5 mg refill Last OV:03/31/17 Last refill:10/03/16 90 tab/1 refill ZOX:[id number] (upcoming establish care appointment 07/15/17) Pharmacy: [id number] - [place], [place] - [address] AT [facility] [address] [phone] (Phone) [phone] (Fax)

## 2017-07-09 DIAGNOSIS — F331 Major depressive disorder, recurrent, moderate: Secondary | ICD-10-CM | POA: Diagnosis not present

## 2017-07-09 DIAGNOSIS — F411 Generalized anxiety disorder: Secondary | ICD-10-CM | POA: Diagnosis not present

## 2017-07-15 ENCOUNTER — Encounter: Payer: Self-pay | Admitting: Family Medicine

## 2017-07-15 ENCOUNTER — Other Ambulatory Visit: Payer: Self-pay

## 2017-07-15 ENCOUNTER — Ambulatory Visit: Payer: BLUE CROSS/BLUE SHIELD | Admitting: Family Medicine

## 2017-07-15 VITALS — BP 122/74 | HR 98 | Temp 98.8°F | Ht 65.5 in | Wt 232.0 lb

## 2017-07-15 DIAGNOSIS — F418 Other specified anxiety disorders: Secondary | ICD-10-CM | POA: Diagnosis not present

## 2017-07-15 DIAGNOSIS — J4521 Mild intermittent asthma with (acute) exacerbation: Secondary | ICD-10-CM

## 2017-07-15 DIAGNOSIS — E1140 Type 2 diabetes mellitus with diabetic neuropathy, unspecified: Secondary | ICD-10-CM

## 2017-07-15 DIAGNOSIS — Z9109 Other allergy status, other than to drugs and biological substances: Secondary | ICD-10-CM

## 2017-07-15 DIAGNOSIS — R21 Rash and other nonspecific skin eruption: Secondary | ICD-10-CM

## 2017-07-15 MED ORDER — SERTRALINE HCL 25 MG PO TABS: 25.0000 mg | ORAL_TABLET | Freq: Two times a day (BID) | ORAL | Status: DC

## 2017-07-15 MED ORDER — TRIAMCINOLONE ACETONIDE 0.1 % EX CREA: 1.0000 "application " | TOPICAL_CREAM | Freq: Two times a day (BID) | CUTANEOUS | 0 refills | Status: DC

## 2017-07-15 NOTE — Progress Notes (Signed)
5/23/20195:05 PM  Shelby Scott 1965/04/24, 52 y.o. female [id number]  Chief Complaint  Patient presents with  . Follow-up    allergy and asthma. Went to see Psychiatrist 17th of May was given Effoxor. Requesting FMLA time due to all the trouble she having at work  . Pruritis    spot on back that itches and bleeds    HPI:   Patient is a 52 y.o. female with past medical history significant for DM2, depression with anxiety, seasonal allergies, asthma who presents today for followup  On last visit her asthma and allergies were not well controlled Started on flovent HFA and singulair, taking as rx, tolerating well Reports no more sneezing nor runny nose Breathing better, less chest tightness and wheezing, still having some cough, decreased use of albuterol, used a couple times last week Having occ itchy patches of skin and sometimes unilateral lip swelling, resolves with benadryl Declines referral to allergist  Regarding depression and anxiety she has established psych care  She is seeing Dolphus Jenny, NP at [facility] Transitioning from effexor to sertraline Sees her again during 2nd week of June Wondering about FMLA paperwork, has done intermittent FMLA in the past Work is a stressful environment, current trigger Having increased panic attacks, having feelings of needing to just up and leave., which she has done so before Would like to not lose her job if indeed feeling overwhelmed, having panic attacks  Denies SI   Fall Risk  07/15/2017 05/25/2017 03/31/2017 11/05/2016 08/08/2016  Falls in the past year? No No No No No     Depression screen [facility] 2/9 07/15/2017 05/25/2017 03/31/2017  Decreased Interest 0 0 2  Down, Depressed, Hopeless 0 0 3  PHQ - 2 Score 0 0 5  Altered sleeping - - 0  Tired, decreased energy - - 1  Change in appetite - - 1  Feeling bad or failure about yourself  - - 1  Trouble concentrating - - 1  Moving slowly or fidgety/restless - - 0  Suicidal  thoughts - - 1  PHQ-9 Score - - 10  Difficult doing work/chores - - Somewhat difficult    Allergies  Allergen Reactions  . Ibuprofen Other (See Comments)  . Lisinopril Cough    Prior to Admission medications   Medication Sig Start Date End Date Taking? Authorizing Provider  ACCU-CHEK SMARTVIEW test strip USE TWICE DAILY 09/08/16  Yes Romero Belling, MD  ACETYLCARN-ALPHA LIPOIC ACID PO Take 1 tablet by mouth 2 (two) times daily.   Yes [provider]  Adapalene-Benzoyl Peroxide (EPIDUO EX) Apply 1 application topically at bedtime.   Yes [provider]  albuterol (PROAIR HFA) 108 (90 Base) MCG/ACT inhaler INHALE 2 PUFFS INTO THE LUNGS EVERY 6 HOURS AS NEEDED FOR WHEEZING OR SHORTNESS OF BREATH 06/30/17  Yes Myles Lipps, MD  busPIRone (BUSPAR) 15 MG tablet TAKE 1 TABLET(15 MG) BY MOUTH TWICE DAILY 05/31/17  Yes English, [place] D, [place]  CIPRODEX OTIC suspension  08/28/16  Yes [provider]  fluticasone (FLONASE) 50 MCG/ACT nasal spray Place 2 sprays into both nostrils daily. 06/06/16  Yes Peyton Najjar, MD  fluticasone (FLOVENT HFA) 44 MCG/ACT inhaler Inhale 1 puff into the lungs 2 (two) times daily. 06/30/17  Yes Myles Lipps, MD  metFORMIN (GLUCOPHAGE-XR) 500 MG 24 hr tablet Take 4 tablets (2,000 mg total) by mouth daily with breakfast. 07/30/16  Yes Romero Belling, MD  montelukast (SINGULAIR) 10 MG tablet Take 1 tablet (10 mg total)  by mouth at bedtime. 06/30/17  Yes Myles Lipps, MD  mupirocin ointment (BACTROBAN) 2 % Apply 1 application topically 3 (three) times daily. 12/26/15  Yes English, Judeth Cornfield D, PA  ondansetron (ZOFRAN-ODT) 4 MG disintegrating tablet Take 1 tablet (4 mg total) by mouth every 8 (eight) hours as needed for nausea or vomiting. 05/25/17  Yes Wallis Bamberg, PA-C  Polyethyl Glycol-Propyl Glycol (SYSTANE) 0.4-0.3 % GEL ophthalmic gel Place 1 application into both eyes 2 (two) times daily as needed (for dry/irritated eyes).   Yes [provider]  traMADol (ULTRAM) 50 MG tablet Take 1 tablet (50 mg total) by mouth every 8 (eight) hours as needed. 10/31/16  Yes Trena Platt D, PA  venlafaxine XR (EFFEXOR-XR) 150 MG 24 hr capsule TAKE 1 CAPSULE(150 MG) BY MOUTH DAILY WITH BREAKFAST 04/06/17  Yes Shade Flood, MD  VICTOZA 18 MG/3ML SOPN INJECT 1.8 MG UNDER THE SKIN DAILY AS DIRECTED 02/19/17  Yes Romero Belling, MD    Past Medical History:  Diagnosis Date  . Anemia   . Anxiety   . Asthma   . Depression   . Diabetes mellitus without complication (HCC)   . Hypertension     Past Surgical History:  Procedure Laterality Date  . CHOLECYSTECTOMY    . GASTRIC ROUX-EN-Y N/A 06/08/2016   Procedure: LAPAROSCOPIC ROUX-EN-Y GASTRIC BYPASS WITH UPPER ENDOSCOPY;  Surgeon: Luretha Murphy, MD;  Location: [facility];  Service: General;  Laterality: N/A;  . UPPER GI ENDOSCOPY  06/08/2016   Procedure: UPPER GI ENDOSCOPY;  Surgeon: Luretha Murphy, MD;  Location: [facility];  Service: General;;    Social History   Tobacco Use  . Smoking status: Never Smoker  . Smokeless tobacco: Never Used  Substance Use Topics  . Alcohol use: No    Alcohol/week: 0.0 oz    Family History  Problem Relation Age of Onset  . Hypertension Father   . Diabetes Maternal Grandmother   . Mental illness Maternal Grandfather   . Diabetes Paternal Grandmother   . Hypertension Paternal Grandmother   . Heart disease Paternal Grandfather   . Diabetes Brother   . Mental illness Brother     ROS Per hpi  OBJECTIVE:  Blood pressure 122/74, pulse 98, temperature 98.8 F (37.1 C), temperature source Oral, height 5' 5.5" (1.664 m), weight 232 lb (105.2 kg), SpO2 98 %.  Physical Exam  Constitutional: She is oriented to person, place, and time. She appears well-developed and well-nourished.  HENT:  Head: Normocephalic and atraumatic.  Mouth/Throat: Oropharynx is clear and moist. No oropharyngeal exudate.  Eyes: Pupils are equal, round, and reactive to  light. EOM are normal. No scleral icterus.  Neck: Neck supple.  Cardiovascular: Normal rate, regular rhythm and normal heart sounds. Exam reveals no gallop and no friction rub.  No murmur heard. Pulmonary/Chest: Effort normal and breath sounds normal. She has no wheezes. She has no rales.  Musculoskeletal: She exhibits no edema.  Neurological: She is alert and oriented to person, place, and time.  Skin: Skin is warm and dry.  Psychiatric: She has a normal mood and affect.  Nursing note and vitals reviewed.   ASSESSMENT and PLAN  1. Environmental allergies 2. Mild intermittent asthma with acute exacerbation improved control. Advised patient that if she finds herself using albuterol more than twice a week in a consecutive manner to increase flovent HFA to 2 puffs BID.  RTC precautions given  3. Depression with anxiety Currently under psych care, transitioning meds. Discussed with patient  that I would be ok with completing intermittent FMLA paperwork for 4 hours a week. This can be used for appointments or if needed for panic attacks that are not responding to relaxation techniques  4. Rash and nonspecific skin eruption - triamcinolone cream (KENALOG) 0.1 %; Apply 1 application topically 2 (two) times daily.  5. Type 2 diabetes mellitus with diabetic neuropathy, without long-term current use of insulin (HCC) Last a1c done in April. Due for urine microalb/crt ratio, I am unable to find prior. Next DM visit due in July. - Microalbumin, urine   Return in about 1 month (around 08/12/2017).    Myles Lipps, MD Primary Care at [facility] [address] [place], [place] [address] Ph.  [phone] Fax [phone]

## 2017-07-15 NOTE — Patient Instructions (Addendum)
1. If you continue to use albuterol more than twice a week over the next 2 weeks, then increase flovent inhaler to 2 puffs twice a day.    IF you received an x-ray today, you will receive an invoice from [facility]. Please contact [facility] at [phone] with questions or concerns regarding your invoice.   IF you received labwork today, you will receive an invoice from [facility]. Please contact [facility] at [phone] with questions or concerns regarding your invoice.   Our billing staff will not be able to assist you with questions regarding bills from these companies.  You will be contacted with the lab results as soon as they are available. The fastest way to get your results is to activate your My Chart account. Instructions are located on the last page of this paperwork. If you have not heard from [place] regarding the results in 2 weeks, please contact this office.

## 2017-07-16 LAB — MICROALBUMIN, URINE: Microalbumin, Urine: 17.2 ug/mL

## 2017-08-12 ENCOUNTER — Ambulatory Visit: Payer: BLUE CROSS/BLUE SHIELD | Admitting: Family Medicine

## 2017-08-13 ENCOUNTER — Other Ambulatory Visit: Payer: Self-pay

## 2017-08-13 ENCOUNTER — Ambulatory Visit: Payer: BLUE CROSS/BLUE SHIELD | Admitting: Family Medicine

## 2017-08-13 ENCOUNTER — Encounter: Payer: Self-pay | Admitting: Family Medicine

## 2017-08-13 VITALS — BP 138/74 | HR 90 | Temp 99.0°F | Ht 66.0 in | Wt 229.4 lb

## 2017-08-13 DIAGNOSIS — K5900 Constipation, unspecified: Secondary | ICD-10-CM

## 2017-08-13 DIAGNOSIS — F411 Generalized anxiety disorder: Secondary | ICD-10-CM | POA: Diagnosis not present

## 2017-08-13 DIAGNOSIS — F331 Major depressive disorder, recurrent, moderate: Secondary | ICD-10-CM | POA: Diagnosis not present

## 2017-08-13 MED ORDER — DOCUSATE SODIUM 100 MG PO CAPS: 100.0000 mg | ORAL_CAPSULE | Freq: Two times a day (BID) | ORAL | 4 refills | Status: DC | PRN

## 2017-08-13 NOTE — Progress Notes (Signed)
6/21/201910:39 AM  Shelby Scott [birth year], 53 y.o. female [id number]  Chief Complaint  Patient presents with  . Constipation    has been having constipaton since bariatric surgery 4/18. Taking fiber for constipation with no resolve. Miralax does not work at all. When she is able to go its very hard and painful. Grandmom dealt with the same issue. Thinks its hereditary    HPI:   Patient is a 52 y.o. female with past medical history significant for chronic constipation who presents today for worsening sx  Long standing, before bypass at times managed with linezz however stopped after having to be hospitalized After her surgery things seems to have gotten better but slowly becoming an issue  Having very hard large stools about every once a week, then normal for a day or 2 and then starts slowing down again Drinks 32 ounces of coffee every day Uses benefiber daily Drinks about 20 ounces of water a day Exercises 30 minutes daily States that miralax not helping Her mother and grandmother also have issues with constipation   Fall Risk  08/13/2017 07/15/2017 05/25/2017 03/31/2017 11/05/2016  Falls in the past year? No No No No No     Depression screen [facility] 2/9 08/13/2017 07/15/2017 05/25/2017  Decreased Interest 0 0 0  Down, Depressed, Hopeless 0 0 0  PHQ - 2 Score 0 0 0  Altered sleeping - - -  Tired, decreased energy - - -  Change in appetite - - -  Feeling bad or failure about yourself  - - -  Trouble concentrating - - -  Moving slowly or fidgety/restless - - -  Suicidal thoughts - - -  PHQ-9 Score - - -  Difficult doing work/chores - - -    Allergies  Allergen Reactions  . Ibuprofen Other (See Comments)  . Lisinopril Cough    Prior to Admission medications   Medication Sig Start Date End Date Taking? Authorizing Provider  ACCU-CHEK SMARTVIEW test strip USE TWICE DAILY 09/08/16  Yes Romero Belling, MD  ACETYLCARN-ALPHA LIPOIC ACID PO Take 1 tablet by mouth 2 (two) times daily.    Yes [provider]  Adapalene-Benzoyl Peroxide (EPIDUO EX) Apply 1 application topically at bedtime.   Yes [provider]  albuterol (PROAIR HFA) 108 (90 Base) MCG/ACT inhaler INHALE 2 PUFFS INTO THE LUNGS EVERY 6 HOURS AS NEEDED FOR WHEEZING OR SHORTNESS OF BREATH 06/30/17  Yes Myles Lipps, MD  busPIRone (BUSPAR) 15 MG tablet TAKE 1 TABLET(15 MG) BY MOUTH TWICE DAILY 05/31/17  Yes English, Rosebud D, [place]  CIPRODEX OTIC suspension  08/28/16  Yes [provider]  fluticasone (FLONASE) 50 MCG/ACT nasal spray Place 2 sprays into both nostrils daily. 06/06/16  Yes Peyton Najjar, MD  fluticasone (FLOVENT HFA) 44 MCG/ACT inhaler Inhale 1 puff into the lungs 2 (two) times daily. 06/30/17  Yes Myles Lipps, MD  metFORMIN (GLUCOPHAGE-XR) 500 MG 24 hr tablet Take 4 tablets (2,000 mg total) by mouth daily with breakfast. 07/30/16  Yes Romero Belling, MD  montelukast (SINGULAIR) 10 MG tablet Take 1 tablet (10 mg total) by mouth at bedtime. 06/30/17  Yes Myles Lipps, MD  mupirocin ointment (BACTROBAN) 2 % Apply 1 application topically 3 (three) times daily. 12/26/15  Yes English, Judeth Cornfield D, PA  ondansetron (ZOFRAN-ODT) 4 MG disintegrating tablet Take 1 tablet (4 mg total) by mouth every 8 (eight) hours as needed for nausea or vomiting. 05/25/17  Yes Wallis Bamberg, PA-C  Polyethyl Glycol-Propyl Glycol (  SYSTANE) 0.4-0.3 % GEL ophthalmic gel Place 1 application into both eyes 2 (two) times daily as needed (for dry/irritated eyes).   Yes [provider]  sertraline (ZOLOFT) 25 MG tablet Take 1 tablet (25 mg total) by mouth 2 (two) times daily. 07/15/17  Yes Myles LippsSantiago, Roselynn Whitacre M, MD  traMADol (ULTRAM) 50 MG tablet Take 1 tablet (50 mg total) by mouth every 8 (eight) hours as needed. 10/31/16  Yes English, Judeth CornfieldStephanie D, PA  triamcinolone cream (KENALOG) 0.1 % Apply 1 application topically 2 (two) times daily. 07/15/17  Yes Myles LippsSantiago, Joanann Mies M, MD  venlafaxine XR (EFFEXOR-XR) 150 MG 24 hr  capsule TAKE 1 CAPSULE(150 MG) BY MOUTH DAILY WITH BREAKFAST 04/06/17  Yes Shade FloodGreene, Jeffrey R, MD  VICTOZA 18 MG/3ML SOPN INJECT 1.8 MG UNDER THE SKIN DAILY AS DIRECTED 02/19/17  Yes Romero BellingEllison, Sean, MD    Past Medical History:  Diagnosis Date  . Anemia   . Anxiety   . Asthma   . Depression   . Diabetes mellitus without complication (HCC)   . Hypertension     Past Surgical History:  Procedure Laterality Date  . CHOLECYSTECTOMY    . GASTRIC ROUX-EN-Y N/A 06/08/2016   Procedure: LAPAROSCOPIC ROUX-EN-Y GASTRIC BYPASS WITH UPPER ENDOSCOPY;  Surgeon: Luretha MurphyMatthew Martin, MD;  Location: [facility];  Service: General;  Laterality: N/A;  . UPPER GI ENDOSCOPY  06/08/2016   Procedure: UPPER GI ENDOSCOPY;  Surgeon: Luretha MurphyMatthew Martin, MD;  Location: [facility];  Service: General;;    Social History   Tobacco Use  . Smoking status: Never Smoker  . Smokeless tobacco: Never Used  Substance Use Topics  . Alcohol use: No    Alcohol/week: 0.0 oz    Family History  Problem Relation Age of Onset  . Hypertension Father   . Diabetes Maternal Grandmother   . Mental illness Maternal Grandfather   . Diabetes Paternal Grandmother   . Hypertension Paternal Grandmother   . Heart disease Paternal Grandfather   . Diabetes Brother   . Mental illness Brother     Review of Systems  Constitutional: Negative for chills, fever and malaise/fatigue.  Gastrointestinal: Negative for abdominal pain, blood in stool, melena, nausea and vomiting.     OBJECTIVE:  Blood pressure 138/74, pulse 90, temperature 99 F (37.2 C), temperature source Oral, height 5\' 6"  (1.676 m), weight 229 lb 6.4 oz (104.1 kg), SpO2 98 %.  Physical Exam  Constitutional: She is oriented to person, place, and time. She appears well-developed and well-nourished.  HENT:  Head: Normocephalic and atraumatic.  Mouth/Throat: Oropharynx is clear and moist. No oropharyngeal exudate.  Eyes: Pupils are equal, round, and reactive to light. EOM are normal. No  scleral icterus.  Neck: Neck supple.  Cardiovascular: Normal rate, regular rhythm and normal heart sounds. Exam reveals no gallop and no friction rub.  No murmur heard. Pulmonary/Chest: Effort normal and breath sounds normal. She has no wheezes. She has no rales.  Abdominal: Soft. Bowel sounds are normal. She exhibits no distension and no mass. There is no tenderness.  Musculoskeletal: She exhibits no edema.  Neurological: She is alert and oriented to person, place, and time.  Skin: Skin is warm and dry.  Psychiatric: She has a normal mood and affect.  Nursing note and vitals reviewed.   ASSESSMENT and PLAN  1. Constipation, unspecified constipation type Discussed importance of increasing water and cutting back on coffee. Adding stool softener, consider increasing miralax. Instruction given. RTC precautions reviewed. Advised followup with her bypass surgeon.  Meds ordered this encounter  Medications  . docusate sodium (COLACE) 100 MG capsule    Sig: Take 1 capsule (100 mg total) by mouth 2 (two) times daily as needed for mild constipation.    Dispense:  60 capsule    Refill:  4   Return if symptoms worsen or fail to improve.    Myles Lipps, MD Primary Care at [facility] [address] [place], [place] [address] Ph.  [phone] Fax [phone]

## 2017-08-13 NOTE — Patient Instructions (Addendum)
     IF you received an x-ray today, you will receive an invoice from [facility]. Please contact [facility] at [phone] with questions or concerns regarding your invoice.   IF you received labwork today, you will receive an invoice from [facility]. Please contact [facility] at [phone] with questions or concerns regarding your invoice.   Our billing staff will not be able to assist you with questions regarding bills from these companies.  You will be contacted with the lab results as soon as they are available. The fastest way to get your results is to activate your My Chart account. Instructions are located on the last page of this paperwork. If you have not heard from us regarding the results in 2 weeks, please contact this office.     For constipation   Make sure you are drinking enough water daily. Make sure you are getting enough fiber in your diet - this will make you regular - you can eat high fiber foods or use metamucil as a supplement - it is really important to drink enough water when using fiber supplements.  If your stools are hard or are formed balls or you have to strain a stool softener will help - use colace 2-3 capsule a day  For gentle treatment of constipation Use Miralax 1-2 capfuls a day until your stools are soft and regular and then decrease the usage - you can use this daily  For more aggressive treatment of constipation Use 4 capfuls of Colace and 6 doses of Miralax and drink it in 2 hours - this should result in several watery stools - if it does not repeat the next day and then go to daily miralax for a week to make sure your bowels are clean and retrained to work properly  For the most aggressive treatment of constipation Use 14 capfuls of Miralax in 1 gallon of fluid (gatoraid or water work well or a combination of the two) and drink over 12h - it is ok to eat during this time and then use Miralax 1 capful daily for about 2 weeks to  prevent the constipation from returning  

## 2017-09-09 DIAGNOSIS — Z9884 Bariatric surgery status: Secondary | ICD-10-CM | POA: Diagnosis not present

## 2017-09-09 DIAGNOSIS — R1011 Right upper quadrant pain: Secondary | ICD-10-CM | POA: Diagnosis not present

## 2017-10-13 ENCOUNTER — Ambulatory Visit: Payer: BLUE CROSS/BLUE SHIELD | Admitting: Family Medicine

## 2017-10-13 ENCOUNTER — Other Ambulatory Visit: Payer: Self-pay

## 2017-10-13 ENCOUNTER — Encounter: Payer: Self-pay | Admitting: Family Medicine

## 2017-10-13 VITALS — BP 110/60 | HR 109 | Temp 98.4°F | Resp 16 | Ht 66.0 in | Wt 226.2 lb

## 2017-10-13 DIAGNOSIS — L219 Seborrheic dermatitis, unspecified: Secondary | ICD-10-CM | POA: Diagnosis not present

## 2017-10-13 DIAGNOSIS — K5900 Constipation, unspecified: Secondary | ICD-10-CM | POA: Diagnosis not present

## 2017-10-13 DIAGNOSIS — E1140 Type 2 diabetes mellitus with diabetic neuropathy, unspecified: Secondary | ICD-10-CM

## 2017-10-13 DIAGNOSIS — B372 Candidiasis of skin and nail: Secondary | ICD-10-CM | POA: Diagnosis not present

## 2017-10-13 LAB — POCT GLYCOSYLATED HEMOGLOBIN (HGB A1C)
HbA1c POC (<> result, manual entry): 7.5 % (ref 4.0–5.6)
HbA1c, POC (controlled diabetic range): 7.5 % — AB (ref 0.0–7.0)
HbA1c, POC (prediabetic range): 7.5 % — AB (ref 5.7–6.4)
Hemoglobin A1C: 7.5 % — AB (ref 4.0–5.6)

## 2017-10-13 MED ORDER — NYSTATIN 100000 UNIT/GM EX POWD: Freq: Two times a day (BID) | CUTANEOUS | 0 refills | Status: DC

## 2017-10-13 MED ORDER — METFORMIN HCL ER 500 MG PO TB24: 2000.0000 mg | ORAL_TABLET | Freq: Every day | ORAL | 3 refills | Status: DC

## 2017-10-13 NOTE — Progress Notes (Signed)
8/21/20193:41 PM  Shelby BrackettShann Scott [birth year], 52 y.o. female [id number]  Chief Complaint  Patient presents with  . Rash    dry rash for about a week, also on under stomach, frequention urination x 1 week     HPI:   Patient is a 52 y.o. female with past medical history significant for DM2, obesity s/p bariatric surgery, constipation, depression and anxiety who presents today for followup  Constipation much better on colace and miralax  Saw her bariatric surgeon, felt that everything was good Has only been taking just metformin 500mg  BID and daily exercise, cbg runs between 100-150s Last a1c 7.8 in April 2019 Stopped victoza at least a month, maybe 2 months ago, bariatric surgeon thought that victoza might be holding back her weight loss Since then she has lost 4 lbs Has not been able to get a hold of Dr AetnaEllison's office, needs refill of metformin  Has been getting a itchy rash along pannus from all this exercise/sweating Has been applying med rx by derm but not helping (ketoconazole and fluticasone)  She also is getting rough flaky spots along eyebrows and hairline  Fall Risk  10/13/2017 08/13/2017 07/15/2017 05/25/2017 03/31/2017  Falls in the past year? No No No No No     [facility] 2/9 10/13/2017 08/13/2017 07/15/2017  Decreased Interest 0 0 0  Down, Depressed, Hopeless 0 0 0  PHQ - 2 Score 0 0 0  Altered sleeping - - -  Tired, decreased energy - - -  Change in appetite - - -  Feeling bad or failure about yourself  - - -  Trouble concentrating - - -  Moving slowly or fidgety/restless - - -  Suicidal thoughts - - -  PHQ-9 Score - - -  Difficult doing work/chores - - -  Some recent data might be hidden    Allergies  Allergen Reactions  . Ibuprofen Other (See Comments)  . Lisinopril Cough    Prior to Admission medications   Medication Sig Start Date End Date Taking? Authorizing Provider  ACCU-CHEK SMARTVIEW test strip USE TWICE DAILY 09/08/16  Yes Romero BellingEllison, Sean,  MD  ACETYLCARN-ALPHA LIPOIC ACID PO Take 1 tablet by mouth 2 (two) times daily.   Yes [provider]  Adapalene-Benzoyl Peroxide (EPIDUO EX) Apply 1 application topically at bedtime.   Yes [provider]  albuterol (PROAIR HFA) 108 (90 Base) MCG/ACT inhaler INHALE 2 PUFFS INTO THE LUNGS EVERY 6 HOURS AS NEEDED FOR WHEEZING OR SHORTNESS OF BREATH 06/30/17  Yes Myles LippsSantiago, Sandie Swayze M, MD  busPIRone (BUSPAR) 15 MG tablet TAKE 1 TABLET(15 MG) BY MOUTH TWICE DAILY 05/31/17  Yes English, [place]phanie D, [place]  CIPRODEX OTIC suspension  08/28/16  Yes [provider]  docusate sodium (COLACE) 100 MG capsule Take 1 capsule (100 mg total) by mouth 2 (two) times daily as needed for mild constipation. 08/13/17  Yes Myles LippsSantiago, Kurk Corniel M, MD  fluticasone [facility](FLONASE) 50 MCG/ACT nasal spray Place 2 sprays into both nostrils daily. 06/06/16  Yes Peyton NajjarHopper, David H, MD  fluticasone (FLOVENT HFA) 44 MCG/ACT inhaler Inhale 1 puff into the lungs 2 (two) times daily. 06/30/17  Yes Myles LippsSantiago, Keidrick Murty M, MD  metFORMIN (GLUCOPHAGE-XR) 500 MG 24 hr tablet Take 4 tablets (2,000 mg total) by mouth daily with breakfast. 07/30/16  Yes Romero BellingEllison, Sean, MD  montelukast (SINGULAIR) 10 MG tablet Take 1 tablet (10 mg total) by mouth at bedtime. 06/30/17  Yes Myles LippsSantiago, Chelle Cayton M, MD  mupirocin ointment (BACTROBAN) 2 % Apply  1 application topically 3 (three) times daily. 12/26/15  Yes English, Judeth Cornfield D, PA  ondansetron (ZOFRAN-ODT) 4 MG disintegrating tablet Take 1 tablet (4 mg total) by mouth every 8 (eight) hours as needed for nausea or vomiting. 05/25/17  Yes Wallis Bamberg, PA-C  Polyethyl Glycol-Propyl Glycol (SYSTANE) 0.4-0.3 % GEL ophthalmic gel Place 1 application into both eyes 2 (two) times daily as needed (for dry/irritated eyes).   Yes [provider]  sertraline (ZOLOFT) 25 MG tablet Take 1 tablet (25 mg total) by mouth 2 (two) times daily. 07/15/17  Yes Myles Lipps, MD  traMADol (ULTRAM) 50 MG tablet Take 1 tablet (50 mg total)  by mouth every 8 (eight) hours as needed. 10/31/16  Yes English, Judeth Cornfield D, PA  triamcinolone cream (KENALOG) 0.1 % Apply 1 application topically 2 (two) times daily. 07/15/17  Yes Myles Lipps, MD  venlafaxine XR (EFFEXOR-XR) 150 MG 24 hr capsule TAKE 1 CAPSULE(150 MG) BY MOUTH DAILY WITH BREAKFAST 04/06/17  Yes Shade Flood, MD  VICTOZA 18 MG/3ML SOPN INJECT 1.8 MG UNDER THE SKIN DAILY AS DIRECTED 02/19/17  Yes Romero Belling, MD    Past Medical History:  Diagnosis Date  . Anemia   . Anxiety   . Asthma   . Depression   . Diabetes mellitus without complication (HCC)   . Hypertension     Past Surgical History:  Procedure Laterality Date  . CHOLECYSTECTOMY    . GASTRIC ROUX-EN-Y N/A 06/08/2016   Procedure: LAPAROSCOPIC ROUX-EN-Y GASTRIC BYPASS WITH UPPER ENDOSCOPY;  Surgeon: Luretha Murphy, MD;  Location: [facility];  Service: General;  Laterality: N/A;  . UPPER GI ENDOSCOPY  06/08/2016   Procedure: UPPER GI ENDOSCOPY;  Surgeon: Luretha Murphy, MD;  Location: [facility];  Service: General;;    Social History   Tobacco Use  . Smoking status: Never Smoker  . Smokeless tobacco: Never Used  Substance Use Topics  . Alcohol use: No    Alcohol/week: 0.0 standard drinks    Family History  Problem Relation Age of Onset  . Hypertension Father   . Diabetes Maternal Grandmother   . Mental illness Maternal Grandfather   . Diabetes Paternal Grandmother   . Hypertension Paternal Grandmother   . Heart disease Paternal Grandfather   . Diabetes Brother   . Mental illness Brother     Review of Systems  Constitutional: Negative for chills and fever.  Respiratory: Negative for cough and shortness of breath.   Cardiovascular: Negative for chest pain, palpitations and leg swelling.  Gastrointestinal: Negative for abdominal pain, nausea and vomiting.     OBJECTIVE:  Blood pressure 110/60, pulse (!) 109, temperature 98.4 F (36.9 C), resp. rate 16, height 5\' 6"  (1.676 m), weight 226 lb  3.2 oz (102.6 kg), SpO2 97 %. Body mass index is 36.51 kg/m.   Wt Readings from Last 3 Encounters:  10/13/17 226 lb 3.2 oz (102.6 kg)  08/13/17 229 lb 6.4 oz (104.1 kg)  07/15/17 232 lb (105.2 kg)    Physical Exam  Constitutional: She is oriented to person, place, and time. She appears well-developed and well-nourished.  HENT:  Head: Normocephalic and atraumatic.  Mouth/Throat: Oropharynx is clear and moist. No oropharyngeal exudate.  Eyes: Pupils are equal, round, and reactive to light. EOM are normal. No scleral icterus.  Neck: Neck supple.  Cardiovascular: Normal rate, regular rhythm and normal heart sounds. Exam reveals no gallop and no friction rub.  No murmur heard. Pulmonary/Chest: Effort normal and breath sounds normal. She  has no wheezes. She has no rales.  Musculoskeletal: She exhibits no edema.  Neurological: She is alert and oriented to person, place, and time.  Skin: Skin is warm and dry. Rash (large slight erythematous patch with scaly border within pannus, otherwise nondiscrtete rough flaky patches along eyebrown and hairline) noted.  Nursing note and vitals reviewed.   Results for orders placed or performed in visit on 10/13/17 (from the past 24 hour(s))  POCT glycosylated hemoglobin (Hb A1C)     Status: Abnormal   Collection Time: 10/13/17  4:42 PM  Result Value Ref Range   Hemoglobin A1C 7.5 (A) 4.0 - 5.6 %   HbA1c POC (<> result, manual entry) 7.5 4.0 - 5.6 %   HbA1c, POC (prediabetic range) 7.5 (A) 5.7 - 6.4 %   HbA1c, POC (controlled diabetic range) 7.5 (A) 0.0 - 7.0 %    ASSESSMENT and PLAN  1. Candida infection of flexural skin Discussed supportive measures, new meds r/se/b and RTC precautions.   2. Seborrheic dermatitis Discussed use of OTC selsun blue or similar. Patient educational handout given  3. Type 2 diabetes mellitus with diabetic neuropathy, without long-term current use of insulin (HCC) Stable. A1c close to goal. Increasing metformin to  TDD 2000mg , cont with LFM - POCT glycosylated hemoglobin (Hb A1C)  4. Constipation, unspecified constipation type Controlled. Continue current regime.   Other orders - nystatin (MYCOSTATIN/NYSTOP) powder; Apply topically 2 (two) times daily. - metFORMIN (GLUCOPHAGE-XR) 500 MG 24 hr tablet; Take 4 tablets (2,000 mg total) by mouth daily with breakfast.  Return for CPE.    Myles LippsIrma M Santiago, MD Primary Care at [facility] [address] [place], [place] [address] Ph.  [phone] Fax [phone]

## 2017-10-13 NOTE — Patient Instructions (Addendum)
   If you have lab work done today you will be contacted with your lab results within the next 2 weeks.  If you have not heard from us then please contact us. The fastest way to get your results is to register for My Chart.   IF you received an x-ray today, you will receive an invoice from [facility]. Please contact [facility] at [phone] with questions or concerns regarding your invoice.   IF you received labwork today, you will receive an invoice from [facility]. Please contact [facility] at [phone] with questions or concerns regarding your invoice.   Our billing staff will not be able to assist you with questions regarding bills from these companies.  You will be contacted with the lab results as soon as they are available. The fastest way to get your results is to activate your My Chart account. Instructions are located on the last page of this paperwork. If you have not heard from us regarding the results in 2 weeks, please contact this office.     Seborrheic Dermatitis, Adult Seborrheic dermatitis is a skin disease that causes red, scaly patches. It usually occurs on the scalp, and it is often called dandruff. The patches may appear on other parts of the body. Skin patches tend to appear where there are many oil glands in the skin. Areas of the body that are commonly affected include:  Scalp.  Skin folds of the body.  Ears.  Eyebrows.  Neck.  Face.  Armpits.  The bearded area of men's faces.  The condition may come and go for no known reason, and it is often long-lasting (chronic). What are the causes? The cause of this condition is not known. What increases the risk? This condition is more likely to develop in people who:  Have certain conditions, such as: ? HIV (human immunodeficiency virus). ? AIDS (acquired immunodeficiency syndrome). ? Parkinson disease. ? Mood disorders, such as depression.  Are 40-60 years old.  What are the  signs or symptoms? Symptoms of this condition include:  Thick scales on the scalp.  Redness on the face or in the armpits.  Skin that is flaky. The flakes may be Garden or yellow.  Skin that seems oily or dry but is not helped with moisturizers.  Itching or burning in the affected areas.  How is this diagnosed? This condition is diagnosed with a medical history and physical exam. A sample of your skin may be tested (skin biopsy). You may need to see a skin specialist (dermatologist). How is this treated? There is no cure for this condition, but treatment can help to manage the symptoms. You may get treatment to remove scales, lower the risk of skin infection, and reduce swelling or itching. Treatment may include:  Creams that reduce swelling and irritation (steroids).  Creams that reduce skin yeast.  Medicated shampoo, soaps, moisturizing creams, or ointments.  Medicated moisturizing creams or ointments.  Follow these instructions at home:  Apply over-the-counter and prescription medicines only as told by your health care provider.  Use any medicated shampoo, soaps, skin creams, or ointments only as told by your health care provider.  Keep all follow-up visits as told by your health care provider. This is important. Contact a health care provider if:  Your symptoms do not improve with treatment.  Your symptoms get worse.  You have new symptoms. This information is not intended to replace advice given to you by your health care provider. Make sure you discuss   any questions you have with your health care provider. Document Released: 02/09/2005 Document Revised: 08/30/2015 Document Reviewed: 05/30/2015 Elsevier Interactive Patient Education  2018 [employer]  

## 2017-11-03 DIAGNOSIS — L02821 Furuncle of head [any part, except face]: Secondary | ICD-10-CM | POA: Diagnosis not present

## 2017-11-03 DIAGNOSIS — B9689 Other specified bacterial agents as the cause of diseases classified elsewhere: Secondary | ICD-10-CM | POA: Diagnosis not present

## 2017-11-03 DIAGNOSIS — D485 Neoplasm of uncertain behavior of skin: Secondary | ICD-10-CM | POA: Diagnosis not present

## 2017-11-10 ENCOUNTER — Ambulatory Visit: Payer: Self-pay

## 2017-11-10 NOTE — Telephone Encounter (Signed)
Rec'd call from pt. with c/o "lightheaded feeling and swimmy-headed feeling" over past month, that has increased in frequency.  Reported she called because "it's getting annoying."  Stated her sx's are worse in the morning, when first arising, and upon standing, after she has been sitting for a long period of time.  Denied any fainting.  Stated she has been transitioning from Effexor to Zoloft over past couple of months.  Also, reported has intermittent headaches, and presently c/o steady headache over past 2 days, with intermittent sharp pain into right temple and just above right temple.  Questioned about blood sugars; reported she checks blood sugars once/ day; reported average of 120-150 about lunchtime daily.  Questioned about any ear or sinus congestion; reported she has hx of ringing in ears x 2 yrs., and has been evaluated by ENT for this.  Called [facility] to check on poss. Availability for appt. Per Carollee HerterShannon, okay to sched. At 4:20 PM 9/19 with Dr. Leretha PolSantiago.    Call w/ pt. was accidentally disconnected.  Attempted to call pt. Back.  Left VM with appt. Information for 9/19, and advised to call and confirm she rec'd message, and call back with worsening sx's.        Reason for Disposition . [1] MODERATE dizziness (e.g., vertigo; feels very unsteady, interferes with normal activities) AND [2] has NOT been evaluated by physician for this  Answer Assessment - Initial Assessment Questions 1. DESCRIPTION: "Describe your dizziness."     Hard to walk due weaving back an forth; lightheaded and dizzy 2. VERTIGO: "Do you feel like either you or the room is spinning or tilting?"      denied 3. LIGHTHEADED: "Do you feel lightheaded?" (e.g., somewhat faint, woozy, weak upon standing)     Yes swimmy headed, worse 1st thing in morning or when sitting along time; denied feeling faint 4. SEVERITY: "How bad is it?"  "Can you walk?"   - MILD - Feels unsteady but walking normally.   - MODERATE - Feels very unsteady  when walking, but not falling; interferes with normal activities (e.g., school, work) .   - SEVERE - Unable to walk without falling (requires assistance).     Mild  5. ONSET:  "When did the dizziness begin?"     About one month 6. AGGRAVATING FACTORS: "Does anything make it worse?" (e.g., standing, change in head position)     With position change and moving quickly 7. CAUSE: "What do you think is causing the dizziness?"     Changing medication from Effexor to Zoloft over a few months 8. RECURRENT SYMPTOM: "Have you had dizziness before?" If so, ask: "When was the last time?" "What happened that time?"    "Off and on, but went away on its own."   9. OTHER SYMPTOMS: "Do you have any other symptoms?" (e.g., headache, weakness, numbness, vomiting, earache)     Denied numbness/ weakness of extremities. C/o headache over 2 days with intermittent sharp pain in right temple and above temple region 10. PREGNANCY: "Is there any chance you are pregnant?" "When was your last menstrual period?"      No; no menstrual periods for a while  Protocols used: DIZZINESS - VERTIGO-A-AH

## 2017-11-11 ENCOUNTER — Ambulatory Visit: Payer: BLUE CROSS/BLUE SHIELD | Admitting: Family Medicine

## 2017-11-11 ENCOUNTER — Encounter: Payer: Self-pay | Admitting: Family Medicine

## 2017-11-11 ENCOUNTER — Other Ambulatory Visit: Payer: Self-pay

## 2017-11-11 VITALS — Ht 66.0 in | Wt 230.4 lb

## 2017-11-11 DIAGNOSIS — R42 Dizziness and giddiness: Secondary | ICD-10-CM | POA: Diagnosis not present

## 2017-11-11 DIAGNOSIS — J0110 Acute frontal sinusitis, unspecified: Secondary | ICD-10-CM

## 2017-11-11 MED ORDER — AMOXICILLIN-POT CLAVULANATE 875-125 MG PO TABS: 1.0000 | ORAL_TABLET | Freq: Two times a day (BID) | ORAL | 0 refills | Status: DC

## 2017-11-11 NOTE — Progress Notes (Signed)
9/19/20195:10 PM  Shelby Scott [birth year], 52 y.o. female [id number]  Chief Complaint  Patient presents with  . Dizziness    for the past wks has been having dizziness along with headaches. Some lightheadness and palpitations. Says she monitors bg daily, number look really good. Gets lots of exercise    HPI:   Patient is a 52 y.o. female with past medical history significant for DM2, obesity s/p bariatric surgery, constipation, depression and anxietywho presents today for dizziness and headaches  Has been having lots of headaches, not constant, brought on by activity Pressure, head stuck in vice, putting pressure on her temples makes it better Sometimes stabbing pain on right side Feeling tired, bright lights bothering her No h/o migraines Today after a really bad call she started having palpitations and headache was made worse When she walks she tends to wobble, sp if she just stood up Having sign runny nose, coughing, PND Chronic tinnitus - worse with stress No worsening ear pain No sinus pain No SOB Started about a week Tries to drink as much water as she can Has not been able to exercise recently due to fatigue No blurry or double vision Checking cbgs, no drastic changes No fever, chills, nausea or vomiting, no diarrhea Had eye exam several months ago, normal Wears readers at work Has not been using flonase nor taking singulair of recent Derm started her on doxycycline, started for 11/05/17 for boil  Fall Risk  11/11/2017 10/13/2017 08/13/2017 07/15/2017 05/25/2017  Falls in the past year? No No No No No     Depression screen [facility] 2/9 11/11/2017 10/13/2017 08/13/2017  Decreased Interest 0 0 0  Down, Depressed, Hopeless 0 0 0  PHQ - 2 Score 0 0 0  Altered sleeping - - -  Tired, decreased energy - - -  Change in appetite - - -  Feeling bad or failure about yourself  - - -  Trouble concentrating - - -  Moving slowly or fidgety/restless - - -  Suicidal thoughts - - -    PHQ-9 Score - - -  Difficult doing work/chores - - -  Some recent data might be hidden    Allergies  Allergen Reactions  . Ibuprofen Other (See Comments)  . Lisinopril Cough    Prior to Admission medications   Medication Sig Start Date End Date Taking? Authorizing Provider  ACCU-CHEK SMARTVIEW test strip USE TWICE DAILY 09/08/16  Yes Romero Belling, MD  ACETYLCARN-ALPHA LIPOIC ACID PO Take 1 tablet by mouth 2 (two) times daily.   Yes [provider]  Adapalene-Benzoyl Peroxide (EPIDUO EX) Apply 1 application topically at bedtime.   Yes [provider]  albuterol (PROAIR HFA) 108 (90 Base) MCG/ACT inhaler INHALE 2 PUFFS INTO THE LUNGS EVERY 6 HOURS AS NEEDED FOR WHEEZING OR SHORTNESS OF BREATH 06/30/17  Yes Myles Lipps, MD  busPIRone (BUSPAR) 15 MG tablet TAKE 1 TABLET(15 MG) BY MOUTH TWICE DAILY 05/31/17  Yes English, [place] D, [place]  CIPRODEX OTIC suspension  08/28/16  Yes [provider]  docusate sodium (COLACE) 100 MG capsule Take 1 capsule (100 mg total) by mouth 2 (two) times daily as needed for mild constipation. 08/13/17  Yes Myles Lipps, MD  fluticasone [facility]) 50 MCG/ACT nasal spray Place 2 sprays into both nostrils daily. 06/06/16  Yes Peyton Najjar, MD  fluticasone (FLOVENT HFA) 44 MCG/ACT inhaler Inhale 1 puff into the lungs 2 (two) times daily. 06/30/17  Yes Myles Lipps, MD  metFORMIN (GLUCOPHAGE-XR) 500 MG 24 hr tablet Take 4 tablets (2,000 mg total) by mouth daily with breakfast. 10/13/17  Yes Myles LippsSantiago, Mykala Mccready M, MD  montelukast (SINGULAIR) 10 MG tablet Take 1 tablet (10 mg total) by mouth at bedtime. 06/30/17  Yes Myles LippsSantiago, Jacaria Colburn M, MD  mupirocin ointment (BACTROBAN) 2 % Apply 1 application topically 3 (three) times daily. 12/26/15  Yes English, Judeth CornfieldStephanie D, PA  nystatin (MYCOSTATIN/NYSTOP) powder Apply topically 2 (two) times daily. 10/13/17  Yes Myles LippsSantiago, Symia Herdt M, MD  ondansetron (ZOFRAN-ODT) 4 MG disintegrating tablet Take 1 tablet (4 mg total)  by mouth every 8 (eight) hours as needed for nausea or vomiting. 05/25/17  Yes Wallis BambergMani, Mario, PA-C  Polyethyl Glycol-Propyl Glycol (SYSTANE) 0.4-0.3 % GEL ophthalmic gel Place 1 application into both eyes 2 (two) times daily as needed (for dry/irritated eyes).   Yes [provider]  sertraline (ZOLOFT) 25 MG tablet Take 1 tablet (25 mg total) by mouth 2 (two) times daily. 07/15/17  Yes Myles LippsSantiago, Rohith Fauth M, MD  traMADol (ULTRAM) 50 MG tablet Take 1 tablet (50 mg total) by mouth every 8 (eight) hours as needed. 10/31/16  Yes English, Judeth CornfieldStephanie D, PA  triamcinolone cream (KENALOG) 0.1 % Apply 1 application topically 2 (two) times daily. 07/15/17  Yes Myles LippsSantiago, Lorin Hauck M, MD  venlafaxine XR (EFFEXOR-XR) 150 MG 24 hr capsule TAKE 1 CAPSULE(150 MG) BY MOUTH DAILY WITH BREAKFAST 04/06/17  Yes Shade FloodGreene, Jeffrey R, MD  VICTOZA 18 MG/3ML SOPN INJECT 1.8 MG UNDER THE SKIN DAILY AS DIRECTED 02/19/17  Yes Romero BellingEllison, Sean, MD    Past Medical History:  Diagnosis Date  . Anemia   . Anxiety   . Asthma   . Depression   . Diabetes mellitus without complication (HCC)   . Hypertension     Past Surgical History:  Procedure Laterality Date  . CHOLECYSTECTOMY    . GASTRIC ROUX-EN-Y N/A 06/08/2016   Procedure: LAPAROSCOPIC ROUX-EN-Y GASTRIC BYPASS WITH UPPER ENDOSCOPY;  Surgeon: Luretha MurphyMatthew Martin, MD;  Location: [facility];  Service: General;  Laterality: N/A;  . UPPER GI ENDOSCOPY  06/08/2016   Procedure: UPPER GI ENDOSCOPY;  Surgeon: Luretha MurphyMatthew Martin, MD;  Location: [facility];  Service: General;;    Social History   Tobacco Use  . Smoking status: Never Smoker  . Smokeless tobacco: Never Used  Substance Use Topics  . Alcohol use: No    Alcohol/week: 0.0 standard drinks    Family History  Problem Relation Age of Onset  . Hypertension Father   . Diabetes Maternal Grandmother   . Mental illness Maternal Grandfather   . Diabetes Paternal Grandmother   . Hypertension Paternal Grandmother   . Heart disease Paternal  Grandfather   . Diabetes Brother   . Mental illness Brother     ROS Per hpi  OBJECTIVE:  Height 5\' 6"  (1.676 m), weight 230 lb 6.4 oz (104.5 kg), SpO2 100 %. Body mass index is 37.19 kg/m.  Wt Readings from Last 3 Encounters:  11/11/17 230 lb 6.4 oz (104.5 kg)  10/13/17 226 lb 3.2 oz (102.6 kg)  08/13/17 229 lb 6.4 oz (104.1 kg)   Orthostatics Lying: 110/64, 72 Standing: 118/60, 78 Standing: 102/74, 78  My interpretation of EKG:  NSR, HR 65, no ST changes, poor R wave progression  Physical Exam  Constitutional: She is oriented to person, place, and time. She appears well-developed and well-nourished.  HENT:  Head: Normocephalic and atraumatic.  Right Ear: Hearing, tympanic membrane, external ear and ear canal normal.  Left  Ear: Hearing, tympanic membrane, external ear and ear canal normal.  Nose: Mucosal edema and rhinorrhea present. Right sinus exhibits maxillary sinus tenderness and frontal sinus tenderness. Left sinus exhibits no maxillary sinus tenderness and no frontal sinus tenderness.  Mouth/Throat: Mucous membranes are normal. Posterior oropharyngeal erythema present.  TA pulsatile and nontender TMJ non tender  Eyes: Pupils are equal, round, and reactive to light. Conjunctivae and EOM are normal.  Neck: Neck supple.  Cardiovascular: Normal rate, regular rhythm, normal heart sounds and intact distal pulses. Exam reveals no gallop and no friction rub.  No murmur heard. Pulmonary/Chest: Effort normal and breath sounds normal. She has no wheezes. She has no rales.  Musculoskeletal: She exhibits no edema.  Lymphadenopathy:    She has no cervical adenopathy.  Neurological: She is alert and oriented to person, place, and time.  Skin: Skin is warm and dry. Capillary refill takes less than 2 seconds.  Psychiatric: She has a normal mood and affect.  Nursing note and vitals reviewed.   ASSESSMENT and PLAN  1. Acute non-recurrent frontal sinusitis 2.  Dizziness Discussed supportive measures, new meds r/se/b and RTC precautions. Patient educational handout given. - EKG 12-Lead - amoxicillin-clavulanate (AUGMENTIN) 875-125 MG tablet; Take 1 tablet by mouth 2 (two) times daily.   Discussed stress contribution to headaches, also consider having eye exam  Return if symptoms worsen or fail to improve.    Myles Lipps, MD Primary Care at [facility] [address] [place], [place] [address] Ph.  [phone] Fax [phone]

## 2017-11-11 NOTE — Patient Instructions (Addendum)
If you have lab work done today you will be contacted with your lab results within the next 2 weeks.  If you have not heard from [place] then please contact us. The fastest way to get your results is to register for My Chart. Take sudafed or pseudophenylephrine (these are both oral decongestants)  IF you received an x-ray today, you will receive an invoice from [facility]. Please contact [facility] at [phone] with questions or concerns regarding your invoice.   IF you received labwork today, you will receive an invoice from [facility]. Please contact [facility] at [phone] with questions or concerns regarding your invoice.   Our billing staff will not be able to assist you with questions regarding bills from these companies.  You will be contacted with the lab results as soon as they are available. The fastest way to get your results is to activate your My Chart account. Instructions are located on the last page of this paperwork. If you have not heard from [place] regarding the results in 2 weeks, please contact this office.     Sinusitis, Adult Sinusitis is soreness and inflammation of your sinuses. Sinuses are hollow spaces in the bones around your face. Your sinuses are located:  Around your eyes.  In the middle of your forehead.  Behind your nose.  In your cheekbones.  Your sinuses and nasal passages are lined with a stringy fluid (mucus). Mucus normally drains out of your sinuses. When your nasal tissues become inflamed or swollen, the mucus can become trapped or blocked so air cannot flow through your sinuses. This allows bacteria, viruses, and funguses to grow, which leads to infection. Sinusitis can develop quickly and last for 7?10 days (acute) or for more than 12 weeks (chronic). Sinusitis often develops after a cold. What are the causes? This condition is caused by anything that creates swelling in the sinuses or stops mucus from draining,  including:  Allergies.  Asthma.  Bacterial or viral infection.  Abnormally shaped bones between the nasal passages.  Nasal growths that contain mucus (nasal polyps).  Narrow sinus openings.  Pollutants, such as chemicals or irritants in the air.  A foreign object stuck in the nose.  A fungal infection. This is rare.  What increases the risk? The following factors may make you more likely to develop this condition:  Having allergies or asthma.  Having had a recent cold or respiratory tract infection.  Having structural deformities or blockages in your nose or sinuses.  Having a weak immune system.  Doing a lot of swimming or diving.  Overusing nasal sprays.  Smoking.  What are the signs or symptoms? The main symptoms of this condition are pain and a feeling of pressure around the affected sinuses. Other symptoms include:  Upper toothache.  Earache.  Headache.  Bad breath.  Decreased sense of smell and taste.  A cough that may get worse at night.  Fatigue.  Fever.  Thick drainage from your nose. The drainage is often green and it may contain pus (purulent).  Stuffy nose or congestion.  Postnasal drip. This is when extra mucus collects in the throat or back of the nose.  Swelling and warmth over the affected sinuses.  Sore throat.  Sensitivity to light.  How is this diagnosed? This condition is diagnosed based on symptoms, a medical history, and a physical exam. To find out if your condition is acute or chronic, your health care provider may:  Look in your nose  for signs of nasal polyps.  Tap over the affected sinus to check for signs of infection.  View the inside of your sinuses using an imaging device that has a light attached (endoscope).  If your health care provider suspects that you have chronic sinusitis, you may also:  Be tested for allergies.  Have a sample of mucus taken from your nose (nasal culture) and checked for  bacteria.  Have a mucus sample examined to see if your sinusitis is related to an allergy.  If your sinusitis does not respond to treatment and it lasts longer than 8 weeks, you may have an MRI or CT scan to check your sinuses. These scans also help to determine how severe your infection is. In rare cases, a bone biopsy may be done to rule out more serious types of fungal sinus disease. How is this treated? Treatment for sinusitis depends on the cause and whether your condition is chronic or acute. If a virus is causing your sinusitis, your symptoms will go away on their own within 10 days. You may be given medicines to relieve your symptoms, including:  Topical nasal decongestants. They shrink swollen nasal passages and let mucus drain from your sinuses.  Antihistamines. These drugs block inflammation that is triggered by allergies. This can help to ease swelling in your nose and sinuses.  Topical nasal corticosteroids. These are nasal sprays that ease inflammation and swelling in your nose and sinuses.  Nasal saline washes. These rinses can help to get rid of thick mucus in your nose.  If your condition is caused by bacteria, you will be given an antibiotic medicine. If your condition is caused by a fungus, you will be given an antifungal medicine. Surgery may be needed to correct underlying conditions, such as narrow nasal passages. Surgery may also be needed to remove polyps. Follow these instructions at home: Medicines  Take, use, or apply over-the-counter and prescription medicines only as told by your health care provider. These may include nasal sprays.  If you were prescribed an antibiotic medicine, take it as told by your health care provider. Do not stop taking the antibiotic even if you start to feel better. Hydrate and Humidify  Drink enough water to keep your urine clear or pale yellow. Staying hydrated will help to thin your mucus.  Use a cool mist humidifier to keep the  humidity level in your home above 50%.  Inhale steam for 10-15 minutes, 3-4 times a day or as told by your health care provider. You can do this in the bathroom while a hot shower is running.  Limit your exposure to cool or dry air. Rest  Rest as much as possible.  Sleep with your head raised (elevated).  Make sure to get enough sleep each night. General instructions  Apply a warm, moist washcloth to your face 3-4 times a day or as told by your health care provider. This will help with discomfort.  Wash your hands often with soap and water to reduce your exposure to viruses and other germs. If soap and water are not available, use hand sanitizer.  Do not smoke. Avoid being around people who are smoking (secondhand smoke).  Keep all follow-up visits as told by your health care provider. This is important. Contact a health care provider if:  You have a fever.  Your symptoms get worse.  Your symptoms do not improve within 10 days. Get help right away if:  You have a severe headache.  You have  persistent vomiting.  You have pain or swelling around your face or eyes.  You have vision problems.  You develop confusion.  Your neck is stiff.  You have trouble breathing. This information is not intended to replace advice given to you by your health care provider. Make sure you discuss any questions you have with your health care provider. Document Released: 02/09/2005 Document Revised: 10/06/2015 Document Reviewed: 12/05/2014 Elsevier Interactive Patient Education  [employer]2018 [employer] Inc[employer]

## 2017-11-16 ENCOUNTER — Telehealth: Payer: Self-pay | Admitting: Family Medicine

## 2017-11-16 NOTE — Telephone Encounter (Signed)
Copied from CRM [id number]. Topic: Quick Communication - See Telephone Encounter >> Nov 16, 2017 10:05 AM Angela NevinWilliams, Candice N wrote: CRM for notification. See Telephone encounter for: 11/16/17.  Pt called stating that after taking amoxicillin-clavulanate (AUGMENTIN) 875-125 MG tablet, she is now experiencing burning with urination. Pt states that she has had yeast infections after taking this medication before and declined an appointment. Pt would like to know if Dr. Leretha PolSantiago could possibly call her in something to help with symptoms. Please advise.   Pt call back# [phone]  Pharmacy:[facility][id number] [place]- [place], [place] - [address] [facility] [address] [phone] (Phone) [phone] (Fax)

## 2017-11-17 MED ORDER — FLUCONAZOLE 150 MG PO TABS: 150.0000 mg | ORAL_TABLET | Freq: Once | ORAL | 0 refills | Status: AC

## 2017-11-17 NOTE — Telephone Encounter (Signed)
done

## 2017-11-17 NOTE — Telephone Encounter (Signed)
Please advise 

## 2017-11-17 NOTE — Telephone Encounter (Signed)
Please advise. Dgaddy, CMA 

## 2017-11-17 NOTE — Telephone Encounter (Signed)
Patient now has a "raging" yeast infection from the antibiotic and needs something called into pharmacy for it.

## 2017-11-17 NOTE — Addendum Note (Signed)
Addended by: Myles Lipps on: 11/17/2017 06:24 PM   Modules accepted: Orders

## 2017-12-11 ENCOUNTER — Encounter: Payer: Self-pay | Admitting: Family Medicine

## 2017-12-11 ENCOUNTER — Ambulatory Visit: Payer: BLUE CROSS/BLUE SHIELD | Admitting: Family Medicine

## 2017-12-11 ENCOUNTER — Other Ambulatory Visit: Payer: Self-pay

## 2017-12-11 VITALS — BP 128/80 | HR 72 | Temp 98.2°F | Ht 66.0 in | Wt 230.0 lb

## 2017-12-11 DIAGNOSIS — Z23 Encounter for immunization: Secondary | ICD-10-CM

## 2017-12-11 DIAGNOSIS — E118 Type 2 diabetes mellitus with unspecified complications: Secondary | ICD-10-CM

## 2017-12-11 DIAGNOSIS — L237 Allergic contact dermatitis due to plants, except food: Secondary | ICD-10-CM

## 2017-12-11 MED ORDER — PREDNISONE 20 MG PO TABS: ORAL_TABLET | ORAL | 0 refills | Status: DC

## 2017-12-11 MED ORDER — METHYLPREDNISOLONE SODIUM SUCC 125 MG IJ SOLR
125.0000 mg | Freq: Once | INTRAMUSCULAR | Status: AC
  Administered 2017-12-11: 125 mg via INTRAVENOUS

## 2017-12-11 MED ORDER — BETAMETHASONE VALERATE 0.1 % EX OINT: 1.0000 "application " | TOPICAL_OINTMENT | Freq: Two times a day (BID) | CUTANEOUS | 0 refills | Status: DC

## 2017-12-11 MED ORDER — PREDNISONE 20 MG PO TABS: 40.0000 mg | ORAL_TABLET | Freq: Every day | ORAL | 0 refills | Status: AC

## 2017-12-11 MED ORDER — HYDROXYZINE HCL 25 MG PO TABS: 12.5000 mg | ORAL_TABLET | Freq: Three times a day (TID) | ORAL | 0 refills | Status: DC | PRN

## 2017-12-11 NOTE — Progress Notes (Signed)
Patient ID: Shelby Scott, female    DOB: June 06, 1965, 52 y.o.   MRN: [id number]  PCP: Patient, No Pcp Per  Chief Complaint  Patient presents with  . Poison Ivy    since last Wednesday, using topicla creams for itching. Rash is beginning to spread  . Nail Problem    left foot, second toenail turned black, this has never happened before, very concerning    Subjective:  HPI Shelby Scott is a 52 y.o. female presents for evaluation rash x 4 days. Reports working outside recently and moving brush. Fears she has made contact with poison ivy which she traditionally has a severe reaction to. Rash is present on her face , all extremities and torso. She has attempted relief with with topical anti-itch creams without any relief. She denies any itching within the oral region of the eyes.  Denies a sensation of her throat itching or closing.  Nail  problem Patient suffers from type 2 diabetes which is been moderately well controlled.  She is concerned that she is developed second toenail on her second digit of the left foot.  She denies any diminishment in sensation of her feet.  Denies injury.  No previous issues with her toenails or infections.  The toe does not hurt. Social History   Socioeconomic History  . Marital status: Divorced    Spouse name: Not on file  . Number of children: 1  . Years of education: Not on file  . Highest education level: Not on file  Occupational History  . Not on file  Social Needs  . Financial resource strain: Not on file  . Food insecurity:    Worry: Not on file    Inability: Not on file  . Transportation needs:    Medical: Not on file    Non-medical: Not on file  Tobacco Use  . Smoking status: Never Smoker  . Smokeless tobacco: Never Used  Substance and Sexual Activity  . Alcohol use: No    Alcohol/week: 0.0 standard drinks  . Drug use: No  . Sexual activity: Not Currently  Lifestyle  . Physical activity:    Days per week: Not on file    Minutes per  session: Not on file  . Stress: Not on file  Relationships  . Social connections:    Talks on phone: Not on file    Gets together: Not on file    Attends religious service: Not on file    Active member of club or organization: Not on file    Attends meetings of clubs or organizations: Not on file    Relationship status: Not on file  . Intimate partner violence:    Fear of current or ex partner: Not on file    Emotionally abused: Not on file    Physically abused: Not on file    Forced sexual activity: Not on file  Other Topics Concern  . Not on file  Social History Narrative  . Not on file    Family History  Problem Relation Age of Onset  . Hypertension Father   . Diabetes Maternal Grandmother   . Mental illness Maternal Grandfather   . Diabetes Paternal Grandmother   . Hypertension Paternal Grandmother   . Heart disease Paternal Grandfather   . Diabetes Brother   . Mental illness Brother      Review of Systems  Patient Active Problem List   Diagnosis Date Noted  . Acute allergic reaction 06/15/2016  . 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 S/P gastric bypass 06/08/2016  . Essential hypertension 05/06/2016  . Type 2 diabetes mellitus with diabetic neuropathy (HCC) 10/08/2014    Allergies  Allergen Reactions  . Ibuprofen Other (See Comments)  . Lisinopril Cough    Prior to Admission medications   Medication Sig Start Date End Date Taking? Authorizing Provider  ACCU-CHEK SMARTVIEW test strip USE TWICE DAILY 09/08/16  Yes Romero Belling, MD  ACETYLCARN-ALPHA LIPOIC ACID PO Take 1 tablet by mouth 2 (two) times daily.   Yes [provider]  Adapalene-Benzoyl Peroxide (EPIDUO EX) Apply 1 application topically at bedtime.   Yes [provider]  albuterol (PROAIR HFA) 108 (90 Base) MCG/ACT inhaler INHALE 2 PUFFS INTO THE LUNGS EVERY 6 HOURS AS NEEDED FOR WHEEZING OR SHORTNESS OF BREATH 06/30/17  Yes Myles Lipps, MD   amoxicillin-clavulanate (AUGMENTIN) 875-125 MG tablet Take 1 tablet by mouth 2 (two) times daily. 11/11/17  Yes Myles Lipps, MD  CIPRODEX OTIC suspension  08/28/16  Yes [provider]  docusate sodium (COLACE) 100 MG capsule Take 1 capsule (100 mg total) by mouth 2 (two) times daily as needed for mild constipation. 08/13/17  Yes Myles Lipps, MD  fluticasone [facility]) 50 MCG/ACT nasal spray Place 2 sprays into both nostrils daily. 06/06/16  Yes Peyton Najjar, MD  fluticasone (FLOVENT HFA) 44 MCG/ACT inhaler Inhale 1 puff into the lungs 2 (two) times daily. 06/30/17  Yes Myles Lipps, MD  metFORMIN (GLUCOPHAGE-XR) 500 MG 24 hr tablet Take 4 tablets (2,000 mg total) by mouth daily with breakfast. 10/13/17  Yes Myles Lipps, MD  montelukast (SINGULAIR) 10 MG tablet Take 1 tablet (10 mg total) by mouth at bedtime. 06/30/17  Yes Myles Lipps, MD  mupirocin ointment (BACTROBAN) 2 % Apply 1 application topically 3 (three) times daily. 12/26/15  Yes English, Judeth Cornfield D, PA  nystatin (MYCOSTATIN/NYSTOP) powder Apply topically 2 (two) times daily. 10/13/17  Yes Myles Lipps, MD  ondansetron (ZOFRAN-ODT) 4 MG disintegrating tablet Take 1 tablet (4 mg total) by mouth every 8 (eight) hours as needed for nausea or vomiting. 05/25/17  Yes Wallis Bamberg, PA-C  Polyethyl Glycol-Propyl Glycol (SYSTANE) 0.4-0.3 % GEL ophthalmic gel Place 1 application into both eyes 2 (two) times daily as needed (for dry/irritated eyes).   Yes [provider]  traMADol (ULTRAM) 50 MG tablet Take 1 tablet (50 mg total) by mouth every 8 (eight) hours as needed. 10/31/16  Yes English, Judeth Cornfield D, PA  triamcinolone cream (KENALOG) 0.1 % Apply 1 application topically 2 (two) times daily. 07/15/17  Yes Myles Lipps, MD  VICTOZA 18 MG/3ML SOPN INJECT 1.8 MG UNDER THE SKIN DAILY AS DIRECTED 02/19/17  Yes Romero Belling, MD  betamethasone valerate ointment (VALISONE) 0.1 % Apply 1 application topically 2 (two)  times daily. 12/11/17   Bing Neighbors, FNP  busPIRone (BUSPAR) 15 MG tablet TAKE 1 TABLET(15 MG) BY MOUTH TWICE DAILY Patient not taking: Reported on 12/11/2017 05/31/17   Trena Platt D, PA  hydrOXYzine (ATARAX/VISTARIL) 25 MG tablet Take 0.5-1 tablets (12.5-25 mg total) by mouth every 8 (eight) hours as needed for itching. 12/11/17   Bing Neighbors, FNP  predniSONE (DELTASONE) 20 MG tablet Take 3 PO QAM x3days, 2 PO QAM x3days, 1 PO QAM x3days 12/11/17   Bing Neighbors, FNP  sertraline (ZOLOFT) 25 MG tablet Take 1 tablet (25 mg total) by mouth 2 (two) times daily. Patient not taking: Reported  on 12/11/2017 07/15/17   Myles Lipps, MD  venlafaxine XR (EFFEXOR-XR) 150 MG 24 hr capsule TAKE 1 CAPSULE(150 MG) BY MOUTH DAILY WITH BREAKFAST Patient not taking: Reported on 12/11/2017 04/06/17   Shade Flood, MD    Past Medical, Surgical Family and Social History reviewed and updated.    Objective:   Today's Vitals   12/11/17 1131  BP: 128/80  Pulse: 72  Temp: 98.2 F (36.8 C)  TempSrc: Oral  SpO2: 97%  Weight: 230 lb (104.3 kg)  Height: 5\' 6"  (1.676 m)    Wt Readings from Last 3 Encounters:  12/11/17 230 lb (104.3 kg)  11/11/17 230 lb 6.4 oz (104.5 kg)  10/13/17 226 lb 3.2 oz (102.6 kg)   Physical Exam General appearance: alert, well developed, well nourished, cooperative and in no distress Head: Normocephalic, without obvious abnormality, atraumatic Respiratory: Respirations even and unlabored, normal respiratory rate Heart: rate and rhythm normal. No gallop or murmurs noted on exam  Extremities: Left second digit of the foot- toenail is blackened.   Skin: Urticarial rash hive-like sent on various areas of the skin. Negative for excoriation or exudative drainage. Psych: Appropriate mood and affect. Neurologic: Mental status: Alert, oriented to person, place, and time, thought content appropriate.  Lab Results  Component Value Date   POCGLU 165 (A)  03/30/2015   POCGLU 154 (A) 02/26/2015   POCGLU 236 (A) 01/05/2015    Lab Results  Component Value Date   HGBA1C 7.5 (A) 10/13/2017   HGBA1C 7.5 10/13/2017   HGBA1C 7.5 (A) 10/13/2017   HGBA1C 7.5 (A) 10/13/2017      Assessment & Plan:  1. Poison ivy dermatitis, Contact dermatitis secondary to poison ivy contact.  Will administer 125 of Solu-Medrol here in office today and start on a prednisone taper tomorrow.  For relief of itching.  Betamethasone twice daily.  Patient advised to avoid contact with eyes or genitalia.  2. Diabetic foot problem , left great toenail completely black in.  Will refer to podiatry for further evaluation and management - Ambulatory referral to Podiatry    Meds ordered this encounter  Medications  . DISCONTD: predniSONE (DELTASONE) 20 MG tablet    Sig: Take 3 PO QAM x3days, 2 PO QAM x3days, 1 PO QAM x3days    Dispense:  18 tablet    Refill:  0  . methylPREDNISolone sodium succinate (SOLU-MEDROL) 125 mg/2 mL injection 125 mg  . hydrOXYzine (ATARAX/VISTARIL) 25 MG tablet    Sig: Take 0.5-1 tablets (12.5-25 mg total) by mouth every 8 (eight) hours as needed for itching.    Dispense:  30 tablet    Refill:  0  . betamethasone valerate ointment (VALISONE) 0.1 %    Sig: Apply 1 application topically 2 (two) times daily.    Dispense:  90 g    Refill:  0  . predniSONE (DELTASONE) 20 MG tablet    Sig: Take 2 tablets (40 mg total) by mouth daily with breakfast for 5 days.    Dispense:  10 tablet    Refill:  0   Orders Placed This Encounter  Procedures  . Flu Vaccine QUAD 36+ mos IM  . Ambulatory referral to Podiatry    Referral Priority:   Routine    Referral Type:   Consultation    Referral Reason:   Specialty Services Required    Requested Specialty:   Podiatry    Number of Visits Requested:   3    A total of 25  minutes spent, greater than 50 % of this time was spent counseling and coordination of care.  -The patient was given clear instructions  to go to ER or return to medical center if symptoms do not improve, worsen or new problems develop. The patient verbalized understanding.     Godfrey Pick. Tiburcio Pea, FNP-C Nurse Practitioner (PRN Staff)  Primary Care at [facility] [address] [facility] [address] [place], [place]  [phone]

## 2017-12-11 NOTE — Patient Instructions (Signed)
° ° ° °  If you have lab work done today you will be contacted with your lab results within the next 2 weeks.  If you have not heard from us then please contact us. The fastest way to get your results is to register for My Chart. ° ° °IF you received an x-ray today, you will receive an invoice from [facility]. Please contact [facility] at [phone] with questions or concerns regarding your invoice.  ° °IF you received labwork today, you will receive an invoice from [facility]. Please contact [facility] at [phone] with questions or concerns regarding your invoice.  ° °Our billing staff will not be able to assist you with questions regarding bills from these companies. ° °You will be contacted with the lab results as soon as they are available. The fastest way to get your results is to activate your My Chart account. Instructions are located on the last page of this paperwork. If you have not heard from us regarding the results in 2 weeks, please contact this office. °  ° ° ° °

## 2017-12-25 ENCOUNTER — Ambulatory Visit: Payer: BLUE CROSS/BLUE SHIELD | Admitting: Family Medicine

## 2017-12-25 ENCOUNTER — Encounter: Payer: Self-pay | Admitting: Family Medicine

## 2017-12-25 VITALS — BP 125/75 | HR 88 | Temp 98.3°F | Resp 18 | Ht 66.0 in | Wt 230.8 lb

## 2017-12-25 DIAGNOSIS — R112 Nausea with vomiting, unspecified: Secondary | ICD-10-CM

## 2017-12-25 DIAGNOSIS — L237 Allergic contact dermatitis due to plants, except food: Secondary | ICD-10-CM | POA: Diagnosis not present

## 2017-12-25 LAB — POCT URINALYSIS DIP (MANUAL ENTRY)
Bilirubin, UA: NEGATIVE
Glucose, UA: 250 mg/dL — AB
Leukocytes, UA: NEGATIVE
NITRITE UA: NEGATIVE
PROTEIN UA: NEGATIVE mg/dL
SPEC GRAV UA: 1.02 (ref 1.010–1.025)
UROBILINOGEN UA: 0.2 U/dL
pH, UA: 6 (ref 5.0–8.0)

## 2017-12-25 LAB — POC MICROSCOPIC URINALYSIS (UMFC): MUCUS RE: ABSENT

## 2017-12-25 MED ORDER — CETIRIZINE HCL 10 MG PO TABS: 10.0000 mg | ORAL_TABLET | Freq: Every day | ORAL | 11 refills | Status: DC

## 2017-12-25 MED ORDER — RANITIDINE HCL 150 MG PO TABS: 150.0000 mg | ORAL_TABLET | Freq: Two times a day (BID) | ORAL | 0 refills | Status: DC

## 2017-12-25 MED ORDER — PREDNISONE 20 MG PO TABS: 40.0000 mg | ORAL_TABLET | Freq: Every day | ORAL | 0 refills | Status: AC

## 2017-12-25 NOTE — Progress Notes (Signed)
Chief Complaint  Patient presents with  . Rash    follow up; states it is not getting better; getting worse on her legs; cream does not work  . Abdominal Pain    started late last night; stated she had some vomiting; denies having a BM    HPI     Pt reports that she woke up in the middle of the night and vomited She ate pizza overnight She is s/p roux en y She reports that she mostly was dry heaving with a little liquid She thought she was bloated and took a gas X She is no longer nauseous She feels tired and low energy She has had nothing to eat or drink    Patient was seen for poison ivy on 12/11/17 She reports that she was given prednisone in the office and a 5 day course of prednisone at home as well as hydroxyzine and betamethasone for poison ivy She stopped using the steroid ointment because it was too greasy She was only taking a children's benadryl once daily She was not taking the hydroxyzine She started itching on her body and it spread from her face to her arm down to her legs and groin.    Past Medical History:  Diagnosis Date  . Anemia   . Anxiety   . Asthma   . Depression   . Diabetes mellitus without complication (HCC)   . Hypertension     Current Outpatient Medications  Medication Sig Dispense Refill  . ACCU-CHEK SMARTVIEW test strip USE TWICE DAILY 100 each 11  . ACETYLCARN-ALPHA LIPOIC ACID PO Take 1 tablet by mouth 2 (two) times daily.    . Adapalene-Benzoyl Peroxide (EPIDUO EX) Apply 1 application topically at bedtime.    Marland Kitchen albuterol (PROAIR HFA) 108 (90 Base) MCG/ACT inhaler INHALE 2 PUFFS INTO THE LUNGS EVERY 6 HOURS AS NEEDED FOR WHEEZING OR SHORTNESS OF BREATH 18 g 1  . CIPRODEX OTIC suspension   0  . fluticasone (FLONASE) 50 MCG/ACT nasal spray Place 2 sprays into both nostrils daily. 16 g 2  . fluticasone (FLOVENT HFA) 44 MCG/ACT inhaler Inhale 1 puff into the lungs 2 (two) times daily. 1 Inhaler 12  . hydrOXYzine (ATARAX/VISTARIL) 25 MG  tablet Take 0.5-1 tablets (12.5-25 mg total) by mouth every 8 (eight) hours as needed for itching. 30 tablet 0  . metFORMIN (GLUCOPHAGE-XR) 500 MG 24 hr tablet Take 4 tablets (2,000 mg total) by mouth daily with breakfast. 120 tablet 3  . montelukast (SINGULAIR) 10 MG tablet Take 1 tablet (10 mg total) by mouth at bedtime. 30 tablet 3  . nystatin (MYCOSTATIN/NYSTOP) powder Apply topically 2 (two) times daily. 30 g 0  . ondansetron (ZOFRAN-ODT) 4 MG disintegrating tablet Take 1 tablet (4 mg total) by mouth every 8 (eight) hours as needed for nausea or vomiting. 20 tablet 0  . Polyethyl Glycol-Propyl Glycol (SYSTANE) 0.4-0.3 % GEL ophthalmic gel Place 1 application into both eyes 2 (two) times daily as needed (for dry/irritated eyes).    . traMADol (ULTRAM) 50 MG tablet Take 1 tablet (50 mg total) by mouth every 8 (eight) hours as needed. 21 tablet 0  . triamcinolone cream (KENALOG) 0.1 % Apply 1 application topically 2 (two) times daily. 30 g 0  . VICTOZA 18 MG/3ML SOPN INJECT 1.8 MG UNDER THE SKIN DAILY AS DIRECTED 18 mL 0  . cetirizine (ZYRTEC) 10 MG tablet Take 1 tablet (10 mg total) by mouth daily. 30 tablet 11  . predniSONE (DELTASONE)  20 MG tablet Take 2 tablets (40 mg total) by mouth daily with breakfast for 5 days. 10 tablet 0  . ranitidine (ZANTAC) 150 MG tablet Take 1 tablet (150 mg total) by mouth 2 (two) times daily. 60 tablet 0  . venlafaxine XR (EFFEXOR-XR) 150 MG 24 hr capsule TAKE 1 CAPSULE(150 MG) BY MOUTH DAILY WITH BREAKFAST (Patient not taking: Reported on 12/11/2017) 90 capsule 0   No current facility-administered medications for this visit.     Allergies:  Allergies  Allergen Reactions  . Ibuprofen Other (See Comments)  . Lisinopril Cough    Past Surgical History:  Procedure Laterality Date  . CHOLECYSTECTOMY    . GASTRIC ROUX-EN-Y N/A 06/08/2016   Procedure: LAPAROSCOPIC ROUX-EN-Y GASTRIC BYPASS WITH UPPER ENDOSCOPY;  Surgeon: Luretha Murphy, MD;  Location: [facility];   Service: General;  Laterality: N/A;  . UPPER GI ENDOSCOPY  06/08/2016   Procedure: UPPER GI ENDOSCOPY;  Surgeon: Luretha Murphy, MD;  Location: [facility];  Service: General;;    Social History   Socioeconomic History  . Marital status: Divorced    Spouse name: Not on file  . Number of children: 1  . Years of education: Not on file  . Highest education level: Not on file  Occupational History  . Not on file  Social Needs  . Financial resource strain: Not on file  . Food insecurity:    Worry: Not on file    Inability: Not on file  . Transportation needs:    Medical: Not on file    Non-medical: Not on file  Tobacco Use  . Smoking status: Never Smoker  . Smokeless tobacco: Never Used  Substance and Sexual Activity  . Alcohol use: No    Alcohol/week: 0.0 standard drinks  . Drug use: No  . Sexual activity: Not Currently  Lifestyle  . Physical activity:    Days per week: Not on file    Minutes per session: Not on file  . Stress: Not on file  Relationships  . Social connections:    Talks on phone: Not on file    Gets together: Not on file    Attends religious service: Not on file    Active member of club or organization: Not on file    Attends meetings of clubs or organizations: Not on file    Relationship status: Not on file  Other Topics Concern  . Not on file  Social History Narrative  . Not on file    Family History  Problem Relation Age of Onset  . Hypertension Father   . Diabetes Maternal Grandmother   . Mental illness Maternal Grandfather   . Diabetes Paternal Grandmother   . Hypertension Paternal Grandmother   . Heart disease Paternal Grandfather   . Diabetes Brother   . Mental illness Brother      ROS Review of Systems See HPI Constitution: No fevers or chills No malaise No diaphoresis Skin: see hpi Eyes: no blurry vision, no double vision GU: no dysuria or hematuria Neuro: no dizziness or headaches all others reviewed and negative    Objective: Vitals:   12/25/17 0923  BP: 125/75  Pulse: 88  Resp: 18  Temp: 98.3 F (36.8 C)  TempSrc: Oral  SpO2: 96%  Weight: 230 lb 12.8 oz (104.7 kg)  Height: 5\' 6"  (1.676 m)    Physical Exam Physical Exam  Constitutional: She is oriented to person, place, and time. She appears well-developed and well-nourished.  HENT:  Head: Normocephalic and  atraumatic.  Eyes: Conjunctivae and EOM are normal.  Cardiovascular: Normal rate, regular rhythm and normal heart sounds.   Pulmonary/Chest: Effort normal and breath sounds normal. No respiratory distress. She has no wheezes.  Abdominal: Normal appearance and bowel sounds are normal. There is no tenderness. There is no CVA tenderness.  Neurological: She is alert and oriented to person, place, and time.  Skin: clusters of erythematous flat lesions on arms, legs and lower abdominal creases Excoriation noted No fluctuance   Assessment and Plan Shelby Scott was seen today for rash and abdominal pain.  Diagnoses and all orders for this visit:  Poison ivy dermatitis- discussed contact precautions Gave another round of steroids as pt is incompletely treated -     predniSONE (DELTASONE) 20 MG tablet; Take 2 tablets (40 mg total) by mouth daily with breakfast for 5 days. -     ranitidine (ZANTAC) 150 MG tablet; Take 1 tablet (150 mg total) by mouth 2 (two) times daily. -     cetirizine (ZYRTEC) 10 MG tablet; Take 1 tablet (10 mg total) by mouth daily.  Nausea and vomiting in adult- no UTI Reviewed urine test and advised hydration  -     POCT urinalysis dipstick -     POCT Microscopic Urinalysis ([facility])       Ioma Chismar A Creta Levin

## 2017-12-25 NOTE — Patient Instructions (Addendum)
If you have lab work done today you will be contacted with your lab results within the next 2 weeks.  If you have not heard from [place] then please contact us. The fastest way to get your results is to register for My Chart.   IF you received an x-ray today, you will receive an invoice from [facility]. Please contact [facility] at [phone] with questions or concerns regarding your invoice.   IF you received labwork today, you will receive an invoice from [facility]. Please contact [facility] at [phone] with questions or concerns regarding your invoice.   Our billing staff will not be able to assist you with questions regarding bills from these companies.  You will be contacted with the lab results as soon as they are available. The fastest way to get your results is to activate your My Chart account. Instructions are located on the last page of this paperwork. If you have not heard from [place] regarding the results in 2 weeks, please contact this office.      Poison Ivy Dermatitis Poison ivy dermatitis is inflammation of the skin that is caused by the allergens on the leaves of the poison ivy plant. The skin reaction often involves redness, swelling, blisters, and extreme itching. What are the causes? This condition is caused by a specific chemical (urushiol) found in the sap of the poison ivy plant. This chemical is sticky and can be easily spread to people, animals, and objects. You can get poison ivy dermatitis by:  Having direct contact with a poison ivy plant.  Touching animals, other people, or objects that have come in contact with poison ivy and have the chemical on them.  What increases the risk? This condition is more likely to develop in:  People who are outdoors often.  People who go outdoors without wearing protective clothing, such as closed shoes, long pants, and a long-sleeved shirt.  What are the signs or symptoms? Symptoms of this condition  include:  Redness and itching.  A rash that often includes bumps and blisters. The rash usually appears 48 hours after exposure.  Swelling. This may occur if the reaction is more severe.  Symptoms usually last for 1-2 weeks. However, the first time you develop this condition, symptoms may last 3-4 weeks. How is this diagnosed? This condition may be diagnosed based on your symptoms and a physical exam. Your health care provider may also ask you about any recent outdoor activity. How is this treated? Treatment for this condition will vary depending on how severe it is. Treatment may include:  Hydrocortisone creams or calamine lotions to relieve itching.  Oatmeal baths to soothe the skin.  Over-the-counter antihistamine tablets.  Oral steroid medicine for more severe outbreaks.  Follow these instructions at home:  Take or apply over-the-counter and prescription medicines only as told by your health care provider.  Wash exposed skin as soon as possible with soap and cold water.  Use hydrocortisone creams or calamine lotion as needed to soothe the skin and relieve itching.  Take oatmeal baths as needed. Use colloidal oatmeal. You can get this at your local pharmacy or grocery store. Follow the instructions on the packaging.  Do not scratch or rub your skin.  While you have the rash, wash clothes right after you wear them. How is this prevented?  Learn to identify the poison ivy plant and avoid contact with the plant. This plant can be recognized by the number of leaves. Generally, poison  ivy has three leaves with flowering branches on a single stem. The leaves are typically glossy, and they have jagged edges that come to a point at the front.  If you have been exposed to poison ivy, thoroughly wash with soap and water right away. You have about 30 minutes to remove the plant resin before it will cause the rash. Be sure to wash under your fingernails because any plant resin there  will continue to spread the rash.  When hiking or camping, wear clothes that will help you to avoid exposure on the skin. This includes long pants, a long-sleeved shirt, tall socks, and hiking boots. You can also apply preventive lotion to your skin to help limit exposure.  If you suspect that your clothes or outdoor gear came in contact with poison ivy, rinse them off outside with a garden hose before you bring them inside your house. Contact a health care provider if:  You have open sores in the rash area.  You have more redness, swelling, or pain in the affected area.  You have redness that spreads beyond the rash area.  You have fluid, blood, or pus coming from the affected area.  You have a fever.  You have a rash over a large area of your body.  You have a rash on your eyes, mouth, or genitals.  Your rash does not improve after a few days. Get help right away if:  Your face swells or your eyes swell shut.  You have trouble breathing.  You have trouble swallowing. This information is not intended to replace advice given to you by your health care provider. Make sure you discuss any questions you have with your health care provider. Document Released: 02/07/2000 Document Revised: 07/18/2015 Document Reviewed: 07/18/2014 Elsevier Interactive Patient Education  [employer].

## 2018-01-06 ENCOUNTER — Ambulatory Visit: Payer: BLUE CROSS/BLUE SHIELD | Admitting: Podiatry

## 2018-01-06 ENCOUNTER — Encounter: Payer: Self-pay | Admitting: Podiatry

## 2018-01-06 VITALS — BP 134/78 | HR 83

## 2018-01-06 DIAGNOSIS — M79675 Pain in left toe(s): Secondary | ICD-10-CM

## 2018-01-06 DIAGNOSIS — M79674 Pain in right toe(s): Secondary | ICD-10-CM | POA: Diagnosis not present

## 2018-01-06 DIAGNOSIS — E1142 Type 2 diabetes mellitus with diabetic polyneuropathy: Secondary | ICD-10-CM | POA: Diagnosis not present

## 2018-01-06 DIAGNOSIS — B351 Tinea unguium: Secondary | ICD-10-CM | POA: Diagnosis not present

## 2018-01-06 NOTE — Patient Instructions (Addendum)
Purchase [employer] with stretchable uppers from [employer] or [email or web address]  Apply Eucerin Foot Cream to both feet once daily.  Apply antibiotic cream to toes as needed.   Diabetic Neuropathy Diabetic neuropathy is a nerve disease or nerve damage that is caused by diabetes mellitus. About half of all people with diabetes mellitus have some form of nerve damage. Nerve damage is more common in those who have had diabetes mellitus for many years and who generally have not had good control of their blood sugar (glucose) level. Diabetic neuropathy is a common complication of diabetes mellitus. There are three common types of diabetic neuropathy and a fourth type that is less common and less understood:  Peripheral neuropathy-This is the most common type of diabetic neuropathy. It causes damage to the nerves of the feet and legs first and then eventually the hands and arms. The damage affects the ability to sense touch.  Autonomic neuropathy-This type causes damage to the autonomic nervous system, which controls the following functions: ? Heartbeat. ? Body temperature. ? Blood pressure. ? Urination. ? Digestion. ? Sweating. ? Sexual function.  Focal neuropathy-Focal neuropathy can be painful and unpredictable and occurs most often in older adults with diabetes mellitus. It involves a specific nerve or one area and often comes on suddenly. It usually does not cause long-term problems.  Radiculoplexus neuropathy- Sometimes called lumbosacral radiculoplexus neuropathy, radiculoplexus neuropathy affects the nerves of the thighs, hips, buttocks, or legs. It is more common in people with type 2 diabetes mellitus and in older men. It is characterized by debilitating pain, weakness, and atrophy, usually in the thigh muscles.  What are the causes? The cause of peripheral, autonomic, and focal neuropathies is diabetes mellitus that is uncontrolled and high glucose levels. The cause of  radiculoplexus neuropathy is unknown. However, it is thought to be caused by inflammation related to uncontrolled glucose levels. What are the signs or symptoms? Peripheral Neuropathy Peripheral neuropathy develops slowly over time. When the nerves of the feet and legs no longer work there may be:  Burning, stabbing, or aching pain in the legs or feet.  Inability to feel pressure or pain in your feet. This can lead to: ? Thick calluses over pressure areas. ? Pressure sores. ? Ulcers.  Foot deformities.  Reduced ability to feel temperature changes.  Muscle weakness.  Autonomic Neuropathy The symptoms of autonomic neuropathy vary depending on which nerves are affected. Symptoms may include:  Problems with digestion, such as: ? Feeling sick to your stomach (nausea). ? Vomiting. ? Bloating. ? Constipation. ? Diarrhea. ? Abdominal pain.  Difficulty with urination. This occurs if you lose your ability to sense when your bladder is full. Problems include: ? Urine leakage (incontinence). ? Inability to empty your bladder completely (retention).  Rapid or irregular heartbeat (palpitations).  Blood pressure drops when you stand up (orthostatic hypotension). When you stand up you may feel: ? Dizzy. ? Weak. ? Faint.  In men, inability to attain and maintain an erection.  In women, vaginal dryness and problems with decreased sexual desire and arousal.  Problems with body temperature regulation.  Increased or decreased sweating.  Focal Neuropathy  Abnormal eye movements or abnormal alignment of both eyes.  Weakness in the wrist.  Foot drop. This results in an inability to lift the foot properly and abnormal walking or foot movement.  Paralysis on one side of your face (Bell palsy).  Chest or abdominal pain. Radiculoplexus Neuropathy  Sudden, severe pain in your hip, thigh,  or buttocks.  Weakness and wasting of thigh muscles.  Difficulty rising from a seated  position.  Abdominal swelling.  Unexplained weight loss (usually more than 10 lb [4.5 kg]). How is this diagnosed? Peripheral Neuropathy Your senses may be tested. Sensory function testing can be done with:  A light touch using a monofilament.  A vibration with tuning fork.  A sharp sensation with a pin prick.  Other tests that can help diagnose neuropathy are:  Nerve conduction velocity. This test checks the transmission of an electrical current through a nerve.  Electromyography. This shows how muscles respond to electrical signals transmitted by nearby nerves.  Quantitative sensory testing. This is used to assess how your nerves respond to vibrations and changes in temperature.  Autonomic Neuropathy Diagnosis is often based on reported symptoms. Tell your health care provider if you experience:  Dizziness.  Constipation.  Diarrhea.  Inappropriate urination or inability to urinate.  Inability to get or maintain an erection.  Tests that may be done include:  Electrocardiography or Holter monitor. These are tests that can help show problems with the heart rate or heart rhythm.  An X-ray exam may be done.  Focal Neuropathy Diagnosis is made based on your symptoms and what your health care provider finds during your exam. Other tests may be done. They may include:  Nerve conduction velocities. This checks the transmission of electrical current through a nerve.  Electromyography. This shows how muscles respond to electrical signals transmitted by nearby nerves.  Quantitative sensory testing. This test is used to assess how your nerves respond to vibration and changes in temperature.  Radiculoplexus Neuropathy  Often the first thing is to eliminate any other issue or problems that might be the cause, as there is no standard test for diagnosis.  X-ray exam of your spine and lumbar region.  Spinal tap to rule out cancer.  MRI to rule out other lesions. How is  this treated? Once nerve damage occurs, it cannot be reversed. The goal of treatment is to keep the disease or nerve damage from getting worse and affecting more nerve fibers. Controlling your blood glucose level is the key. Most people with radiculoplexus neuropathy see at least a partial improvement over time. You will need to keep your blood glucose and HbA1c levels in the target range determined by your health care provider. Things that help control blood glucose levels include:  Blood glucose monitoring.  Meal planning.  Physical activity.  Diabetes medicine.  Over time, maintaining lower blood glucose levels helps lessen symptoms. Sometimes, prescription pain medicine is needed. Follow these instructions at home:  Do not smoke.  Keep your blood glucose level in the range that you and your health care provider have determined acceptable for you.  Keep your blood pressure level in the range that you and your health care provider have determined acceptable for you.  Eat a well-balanced diet.  Be physically active every day. Include strength training and balance exercises.  Protect your feet. ? Check your feet every day for sores, cuts, blisters, or signs of infection. ? Wear padded socks and supportive shoes. Use orthotic inserts, if necessary. ? Regularly check the insides of your shoes for worn spots. Make sure there are no rocks or other items inside your shoes before you put them on. Contact a health care provider if:  You have burning, stabbing, or aching pain in the legs or feet.  You are unable to feel pressure or pain in your feet.  You develop problems with digestion such as: ? Nausea. ? Vomiting. ? Bloating. ? Constipation. ? Diarrhea. ? Abdominal pain.  You have difficulty with urination, such as: ? Incontinence. ? Retention.  You have palpitations.  You develop orthostatic hypotension. When you stand up you may feel: ? Dizzy. ? Weak. ? Faint.  You  cannot attain and maintain an erection (in men).  You have vaginal dryness and problems with decreased sexual desire and arousal (in women).  You have severe pain in your thighs, legs, or buttocks.  You have unexplained weight loss. This information is not intended to replace advice given to you by your health care provider. Make sure you discuss any questions you have with your health care provider. Document Released: 04/20/2001 Document Revised: 07/18/2015 Document Reviewed: 07/21/2012 Elsevier Interactive Patient Education  2017 [employer] Diabetes and Foot Care Diabetes may cause you to have problems because of poor blood supply (circulation) to your feet and legs. This may cause the skin on your feet to become thinner, break easier, and heal more slowly. Your skin may become dry, and the skin may peel and crack. You may also have nerve damage in your legs and feet causing decreased feeling in them. You may not notice minor injuries to your feet that could lead to infections or more serious problems. Taking care of your feet is one of the most important things you can do for yourself. Follow these instructions at home:  Wear shoes at all times, even in the house. Do not go barefoot. Bare feet are easily injured.  Check your feet daily for blisters, cuts, and redness. If you cannot see the bottom of your feet, use a mirror or ask someone for help.  Wash your feet with warm water (do not use hot water) and mild soap. Then pat your feet and the areas between your toes until they are completely dry. Do not soak your feet as this can dry your skin.  Apply a moisturizing lotion or petroleum jelly (that does not contain alcohol and is unscented) to the skin on your feet and to dry, brittle toenails. Do not apply lotion between your toes.  Trim your toenails straight across. Do not dig under them or around the cuticle. File the edges of your nails with an emery board or nail file.  Do not cut  corns or calluses or try to remove them with medicine.  Wear clean socks or stockings every day. Make sure they are not too tight. Do not wear knee-high stockings since they may decrease blood flow to your legs.  Wear shoes that fit properly and have enough cushioning. To break in new shoes, wear them for just a few hours a day. This prevents you from injuring your feet. Always look in your shoes before you put them on to be sure there are no objects inside.  Do not cross your legs. This may decrease the blood flow to your feet.  If you find a minor scrape, cut, or break in the skin on your feet, keep it and the skin around it clean and dry. These areas may be cleansed with mild soap and water. Do not cleanse the area with peroxide, alcohol, or iodine.  When you remove an adhesive bandage, be sure not to damage the skin around it.  If you have a wound, look at it several times a day to make sure it is healing.  Do not use heating pads or hot water bottles. They may  burn your skin. If you have lost feeling in your feet or legs, you may not know it is happening until it is too late.  Make sure your health care provider performs a complete foot exam at least annually or more often if you have foot problems. Report any cuts, sores, or bruises to your health care provider immediately. Contact a health care provider if:  You have an injury that is not healing.  You have cuts or breaks in the skin.  You have an ingrown nail.  You notice redness on your legs or feet.  You feel burning or tingling in your legs or feet.  You have pain or cramps in your legs and feet.  Your legs or feet are numb.  Your feet always feel cold. Get help right away if:  There is increasing redness, swelling, or pain in or around a wound.  There is a red line that goes up your leg.  Pus is coming from a wound.  You develop a fever or as directed by your health care provider.  You notice a bad smell coming  from an ulcer or wound. This information is not intended to replace advice given to you by your health care provider. Make sure you discuss any questions you have with your health care provider. Document Released: 02/07/2000 Document Revised: 07/18/2015 Document Reviewed: 07/19/2012 Elsevier Interactive Patient Education  2017 [employer] Inc[employer]  Corns and Calluses Corns are small areas of thickened skin that occur on the top, sides, or tip of a toe. They contain a cone-shaped core with a point that can press on a nerve below. This causes pain. Calluses are areas of thickened skin that can occur anywhere on the body including hands, fingers, palms, soles of the feet, and heels.Calluses are usually larger than corns. What are the causes? Corns and calluses are caused by rubbing (friction) or pressure, such as from shoes that are too tight or do not fit properly. What increases the risk? Corns are more likely to develop in people who have toe deformities, such as hammer toes. Since calluses can occur with friction to any area of the skin, calluses are more likely to develop in people who:  Work with their hands.  Wear shoes that fit poorly, shoes that are too tight, or shoes that are high-heeled.  Have toes deformities.  What are the signs or symptoms? Symptoms of a corn or callus include:  A hard growth on the skin.  Pain or tenderness under the skin.  Redness and swelling.  Increased discomfort while wearing tight-fitting shoes.  How is this diagnosed? Corns and calluses may be diagnosed with a medical history and physical exam. How is this treated? Corns and calluses may be treated with:  Removing the cause of the friction or pressure. This may include: ? Changing your shoes. ? Wearing shoe inserts (orthotics) or other protective layers in your shoes, such as a corn pad. ? Wearing gloves.  Medicines to help soften skin in the hardened, thickened areas.  Reducing the size of  the corn or callus by removing the dead layers of skin.  Antibiotic medicines to treat infection.  Surgery, if a toe deformity is the cause.  Follow these instructions at home:  Take medicines only as directed by your health care provider.  If you were prescribed an antibiotic, finish all of it even if you start to feel better.  Wear shoes that fit well. Avoid wearing high-heeled shoes and shoes that are too  tight or too loose.  Wear any padding, protective layers, gloves, or orthotics as directed by your health care provider.  Soak your hands or feet and then use a file or pumice stone to soften your corn or callus. Do this as directed by your health care provider.  Check your corn or callus every day for signs of infection. Watch for: ? Redness, swelling, or pain. ? Fluid, blood, or pus. Contact a health care provider if:  Your symptoms do not improve with treatment.  You have increased redness, swelling, or pain at the site of your corn or callus.  You have fluid, blood, or pus coming from your corn or callus.  You have new symptoms. This information is not intended to replace advice given to you by your health care provider. Make sure you discuss any questions you have with your health care provider. Document Released: 11/16/2003 Document Revised: 08/30/2015 Document Reviewed: 02/05/2014 Elsevier Interactive Patient Education  2018 [employer].  Diabetic Neuropathy Diabetic neuropathy is a nerve disease or nerve damage that is caused by diabetes mellitus. About half of all people with diabetes mellitus have some form of nerve damage. Nerve damage is more common in those who have had diabetes mellitus for many years and who generally have not had good control of their blood sugar (glucose) level. Diabetic neuropathy is a common complication of diabetes mellitus. There are three common types of diabetic neuropathy and a fourth type that is less common and less  understood: Peripheral neuropathy-This is the most common type of diabetic neuropathy. It causes damage to the nerves of the feet and legs first and then eventually the hands and arms. The damage affects the ability to sense touch. Autonomic neuropathy-This type causes damage to the autonomic nervous system, which controls the following functions: Heartbeat. Body temperature. Blood pressure. Urination. Digestion. Sweating. Sexual function. Focal neuropathy-Focal neuropathy can be painful and unpredictable and occurs most often in older adults with diabetes mellitus. It involves a specific nerve or one area and often comes on suddenly. It usually does not cause long-term problems. Radiculoplexus neuropathy- Sometimes called lumbosacral radiculoplexus neuropathy, radiculoplexus neuropathy affects the nerves of the thighs, hips, buttocks, or legs. It is more common in people with type 2 diabetes mellitus and in older men. It is characterized by debilitating pain, weakness, and atrophy, usually in the thigh muscles.  What are the causes? The cause of peripheral, autonomic, and focal neuropathies is diabetes mellitus that is uncontrolled and high glucose levels. The cause of radiculoplexus neuropathy is unknown. However, it is thought to be caused by inflammation related to uncontrolled glucose levels. What are the signs or symptoms? Peripheral Neuropathy Peripheral neuropathy develops slowly over time. When the nerves of the feet and legs no longer work there may be: Burning, stabbing, or aching pain in the legs or feet. Inability to feel pressure or pain in your feet. This can lead to: Thick calluses over pressure areas. Pressure sores. Ulcers. Foot deformities. Reduced ability to feel temperature changes. Muscle weakness.  Autonomic Neuropathy The symptoms of autonomic neuropathy vary depending on which nerves are affected. Symptoms may include: Problems with digestion, such as: Feeling  sick to your stomach (nausea). Vomiting. Bloating. Constipation. Diarrhea. Abdominal pain. Difficulty with urination. This occurs if you lose your ability to sense when your bladder is full. Problems include: Urine leakage (incontinence). Inability to empty your bladder completely (retention). Rapid or irregular heartbeat (palpitations). Blood pressure drops when you stand up (orthostatic hypotension). When you stand  up you may feel: Dizzy. Weak. Faint. In men, inability to attain and maintain an erection. In women, vaginal dryness and problems with decreased sexual desire and arousal. Problems with body temperature regulation. Increased or decreased sweating.  Focal Neuropathy Abnormal eye movements or abnormal alignment of both eyes. Weakness in the wrist. Foot drop. This results in an inability to lift the foot properly and abnormal walking or foot movement. Paralysis on one side of your face (Bell palsy). Chest or abdominal pain. Radiculoplexus Neuropathy Sudden, severe pain in your hip, thigh, or buttocks. Weakness and wasting of thigh muscles. Difficulty rising from a seated position. Abdominal swelling. Unexplained weight loss (usually more than 10 lb [4.5 kg]). How is this diagnosed? Peripheral Neuropathy Your senses may be tested. Sensory function testing can be done with: A light touch using a monofilament. A vibration with tuning fork. A sharp sensation with a pin prick.  Other tests that can help diagnose neuropathy are: Nerve conduction velocity. This test checks the transmission of an electrical current through a nerve. Electromyography. This shows how muscles respond to electrical signals transmitted by nearby nerves. Quantitative sensory testing. This is used to assess how your nerves respond to vibrations and changes in temperature.  Autonomic Neuropathy Diagnosis is often based on reported symptoms. Tell your health care provider if you  experience: Dizziness. Constipation. Diarrhea. Inappropriate urination or inability to urinate. Inability to get or maintain an erection.  Tests that may be done include: Electrocardiography or Holter monitor. These are tests that can help show problems with the heart rate or heart rhythm. An X-ray exam may be done.  Focal Neuropathy Diagnosis is made based on your symptoms and what your health care provider finds during your exam. Other tests may be done. They may include: Nerve conduction velocities. This checks the transmission of electrical current through a nerve. Electromyography. This shows how muscles respond to electrical signals transmitted by nearby nerves. Quantitative sensory testing. This test is used to assess how your nerves respond to vibration and changes in temperature.  Radiculoplexus Neuropathy Often the first thing is to eliminate any other issue or problems that might be the cause, as there is no standard test for diagnosis. X-ray exam of your spine and lumbar region. Spinal tap to rule out cancer. MRI to rule out other lesions. How is this treated? Once nerve damage occurs, it cannot be reversed. The goal of treatment is to keep the disease or nerve damage from getting worse and affecting more nerve fibers. Controlling your blood glucose level is the key. Most people with radiculoplexus neuropathy see at least a partial improvement over time. You will need to keep your blood glucose and HbA1c levels in the target range determined by your health care provider. Things that help control blood glucose levels include: Blood glucose monitoring. Meal planning. Physical activity. Diabetes medicine.  Over time, maintaining lower blood glucose levels helps lessen symptoms. Sometimes, prescription pain medicine is needed. Follow these instructions at home: Do not smoke. Keep your blood glucose level in the range that you and your health care provider have determined  acceptable for you. Keep your blood pressure level in the range that you and your health care provider have determined acceptable for you. Eat a well-balanced diet. Be physically active every day. Include strength training and balance exercises. Protect your feet. Check your feet every day for sores, cuts, blisters, or signs of infection. Wear padded socks and supportive shoes. Use orthotic inserts, if necessary. Regularly check the  insides of your shoes for worn spots. Make sure there are no rocks or other items inside your shoes before you put them on. Contact a health care provider if: You have burning, stabbing, or aching pain in the legs or feet. You are unable to feel pressure or pain in your feet. You develop problems with digestion such as: Nausea. Vomiting. Bloating. Constipation. Diarrhea. Abdominal pain. You have difficulty with urination, such as: Incontinence. Retention. You have palpitations. You develop orthostatic hypotension. When you stand up you may feel: Dizzy. Weak. Faint. You cannot attain and maintain an erection (in men). You have vaginal dryness and problems with decreased sexual desire and arousal (in women). You have severe pain in your thighs, legs, or buttocks. You have unexplained weight loss. This information is not intended to replace advice given to you by your health care provider. Make sure you discuss any questions you have with your health care provider. Document Released: 04/20/2001 Document Revised: 07/18/2015 Document Reviewed: 07/21/2012 Elsevier Interactive Patient Education  2017 [employer].

## 2018-01-19 ENCOUNTER — Encounter: Payer: BLUE CROSS/BLUE SHIELD | Admitting: Family Medicine

## 2018-01-23 ENCOUNTER — Other Ambulatory Visit: Payer: Self-pay | Admitting: Family Medicine

## 2018-01-24 NOTE — Telephone Encounter (Signed)
Courtesy refill for 90 tabs. Requested Prescriptions  Pending Prescriptions Disp Refills  . montelukast (SINGULAIR) 10 MG tablet [Pharmacy Med Name: MONTELUKAST 10MG  TABLETS] 90 tablet 0    Sig: TAKE 1 TABLET(10 MG) BY MOUTH AT BEDTIME     Pulmonology:  Leukotriene Inhibitors Passed - 01/23/2018  3:13 PM      Passed - Valid encounter within last 12 months    Recent Outpatient Visits          1 month ago Poison ivy dermatitis   [facility], Manus RuddZoe A, MD   1 month ago Poison ivy dermatitis   [facility], Godfrey PickKimberly S, FNP   2 months ago Acute non-recurrent frontal sinusitis   [facility], Meda CoffeeIrma M, MD   3 months ago Candida infection of flexural skin   [facility], Meda CoffeeIrma M, MD   5 months ago Constipation, unspecified constipation type   [facility], Meda CoffeeIrma M, MD

## 2018-01-30 ENCOUNTER — Encounter: Payer: Self-pay | Admitting: Podiatry

## 2018-01-30 NOTE — Progress Notes (Signed)
Subjective: Shelby Scott presents today referred by Joaquin Courts, FNP, with diabetes and cc of painful, discolored, thick toenails which interfere with daily activities.  Pain is aggravated when wearing enclosed shoe gear. Patient also has neuropathic pain described as stabbing in nature.   Past Medical History:  Diagnosis Date  . Anemia   . Anxiety   . Asthma   . Depression   . Diabetes mellitus without complication (HCC)   . Hypertension     Patient Active Problem List   Diagnosis Date Noted  . Acute allergic reaction 06/15/2016  . 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 S/P gastric bypass 06/08/2016  . Essential hypertension 05/06/2016  . Type 2 diabetes mellitus with diabetic neuropathy (HCC) 10/08/2014    Past Surgical History:  Procedure Laterality Date  . CHOLECYSTECTOMY    . GASTRIC ROUX-EN-Y N/A 06/08/2016   Procedure: LAPAROSCOPIC ROUX-EN-Y GASTRIC BYPASS WITH UPPER ENDOSCOPY;  Surgeon: Luretha Murphy, MD;  Location: [facility];  Service: General;  Laterality: N/A;  . UPPER GI ENDOSCOPY  06/08/2016   Procedure: UPPER GI ENDOSCOPY;  Surgeon: Luretha Murphy, MD;  Location: [facility];  Service: General;;     Current Outpatient Medications:  .  ACCU-CHEK SMARTVIEW test strip, USE TWICE DAILY, Disp: 100 each, Rfl: 11 .  ACETYLCARN-ALPHA LIPOIC ACID PO, Take 1 tablet by mouth 2 (two) times daily., Disp: , Rfl:  .  Adapalene-Benzoyl Peroxide (EPIDUO EX), Apply 1 application topically at bedtime., Disp: , Rfl:  .  albuterol (PROAIR HFA) 108 (90 Base) MCG/ACT inhaler, INHALE 2 PUFFS INTO THE LUNGS EVERY 6 HOURS AS NEEDED FOR WHEEZING OR SHORTNESS OF BREATH, Disp: 18 g, Rfl: 1 .  cetirizine (ZYRTEC) 10 MG tablet, Take 1 tablet (10 mg total) by mouth daily., Disp: 30 tablet, Rfl: 11 .  CIPRODEX OTIC suspension, , Disp: , Rfl: 0 .  fluticasone (FLONASE) 50 MCG/ACT nasal spray, Place 2 sprays into both nostrils daily., Disp: 16 g, Rfl: 2 .  fluticasone (FLOVENT HFA) 44  MCG/ACT inhaler, Inhale 1 puff into the lungs 2 (two) times daily., Disp: 1 Inhaler, Rfl: 12 .  hydrOXYzine (ATARAX/VISTARIL) 25 MG tablet, Take 0.5-1 tablets (12.5-25 mg total) by mouth every 8 (eight) hours as needed for itching., Disp: 30 tablet, Rfl: 0 .  metFORMIN (GLUCOPHAGE-XR) 500 MG 24 hr tablet, Take 4 tablets (2,000 mg total) by mouth daily with breakfast., Disp: 120 tablet, Rfl: 3 .  nystatin (MYCOSTATIN/NYSTOP) powder, Apply topically 2 (two) times daily., Disp: 30 g, Rfl: 0 .  ondansetron (ZOFRAN-ODT) 4 MG disintegrating tablet, Take 1 tablet (4 mg total) by mouth every 8 (eight) hours as needed for nausea or vomiting., Disp: 20 tablet, Rfl: 0 .  Polyethyl Glycol-Propyl Glycol (SYSTANE) 0.4-0.3 % GEL ophthalmic gel, Place 1 application into both eyes 2 (two) times daily as needed (for dry/irritated eyes)., Disp: , Rfl:  .  ranitidine (ZANTAC) 150 MG tablet, Take 1 tablet (150 mg total) by mouth 2 (two) times daily., Disp: 60 tablet, Rfl: 0 .  traMADol (ULTRAM) 50 MG tablet, Take 1 tablet (50 mg total) by mouth every 8 (eight) hours as needed., Disp: 21 tablet, Rfl: 0 .  triamcinolone cream (KENALOG) 0.1 %, Apply 1 application topically 2 (two) times daily., Disp: 30 g, Rfl: 0 .  venlafaxine XR (EFFEXOR-XR) 150 MG 24 hr capsule, TAKE 1 CAPSULE(150 MG) BY MOUTH DAILY WITH BREAKFAST, Disp: 90 capsule, Rfl: 0 .  ACCU-CHEK FASTCLIX LANCETS MISC, USE TO CHECK BLOOD SUGAR  BID, Disp: , Rfl:  .  clobetasol (TEMOVATE) 0.05 % external solution, , Disp: , Rfl: 3 .  cyclobenzaprine (FLEXERIL) 5 MG tablet, , Disp: , Rfl:  .  doxycycline (VIBRA-TABS) 100 MG tablet, TK 1 T PO BID WITH FOOD/WATER. AVOID SUNLIGHT WHILE TAKING THIS MEDICATION., Disp: , Rfl: 3 .  fluconazole (DIFLUCAN) 150 MG tablet, , Disp: , Rfl: 0 .  gabapentin (NEURONTIN) 300 MG capsule, gabapentin 300 mg capsule, Disp: , Rfl:  .  glimepiride (AMARYL) 2 MG tablet, glimepiride 2 mg tablet, Disp: , Rfl:  .  glipiZIDE (GLUCOTROL) 10 MG  tablet, glipizide 10 mg tablet, Disp: , Rfl:  .  insulin aspart (NOVOLOG FLEXPEN) 100 UNIT/ML FlexPen, ADM 10 UNITS [place] TID WITH A MEAL, Disp: , Rfl:  .  Insulin Detemir (LEVEMIR FLEXTOUCH) 100 UNIT/ML Pen, Levemir FlexTouch U-100 Insulin 100 unit/mL (3 mL) subcutaneous pen, Disp: , Rfl:  .  Insulin Glargine (LANTUS SOLOSTAR) 100 UNIT/ML Solostar Pen, ADM 30 UNI [place] HS, Disp: , Rfl:  .  Insulin Pen Needle (BD PEN NEEDLE NANO U/F) 32G X 4 MM MISC, USE TO CHECK BLOOD SUGAR QID, Disp: , Rfl:  .  ketoconazole (NIZORAL) 2 % cream, , Disp: , Rfl: 2 .  levofloxacin (LEVAQUIN) 500 MG tablet, levofloxacin 500 mg tablet, Disp: , Rfl:  .  lisinopril (PRINIVIL,ZESTRIL) 10 MG tablet, lisinopril 10 mg tablet, Disp: , Rfl:  .  methocarbamol (ROBAXIN) 500 MG tablet, TK 1 T PO BID, Disp: , Rfl:  .  montelukast (SINGULAIR) 10 MG tablet, TAKE 1 TABLET(10 MG) BY MOUTH AT BEDTIME, Disp: 90 tablet, Rfl: 0 .  Multiple Vitamins-Minerals (MULTIVITAMIN WITH MINERALS) tablet, Take by mouth., Disp: , Rfl:  .  naproxen (NAPROSYN) 500 MG tablet, TK 1 T PO  BID, Disp: , Rfl:  .  naproxen sodium (ANAPROX) 550 MG tablet, naproxen sodium 550 mg tablet, Disp: , Rfl:  .  ofloxacin (FLOXIN) 0.3 % OTIC solution, ofloxacin 0.3 % ear drops, Disp: , Rfl:  .  orphenadrine (NORFLEX) 100 MG tablet, orphenadrine citrate ER 100 mg tablet,extended release, Disp: , Rfl:  .  permethrin (ELIMITE) 5 % cream, permethrin 5 % topical cream, Disp: , Rfl:  .  VICTOZA 18 MG/3ML SOPN, INJECT 1.8 MG UNDER THE SKIN DAILY AS DIRECTED (Patient not taking: Reported on 01/06/2018), Disp: 18 mL, Rfl: 0  Allergies  Allergen Reactions  . Ibuprofen Other (See Comments)  . Lisinopril Cough    Social History   Occupational History  . Not on file  Tobacco Use  . Smoking status: Never Smoker  . Smokeless tobacco: Never Used  Substance and Sexual Activity  . Alcohol use: No    Alcohol/week: 0.0 standard drinks  . Drug use: No  . Sexual activity: Not  Currently    Family History  Problem Relation Age of Onset  . Hypertension Father   . Diabetes Maternal Grandmother   . Mental illness Maternal Grandfather   . Diabetes Paternal Grandmother   . Hypertension Paternal Grandmother   . Heart disease Paternal Grandfather   . Diabetes Brother   . Mental illness Brother     Immunization History  Administered Date(s) Administered  . Influenza,inj,Quad PF,6+ Mos 01/05/2015, 12/11/2017     Review of systems: Positive Findings in bold print.  Constitutional:  chills, fatigue, fever, sweats, weight change Communication: translator, sign Presenter, broadcastinglanguage translator, hand writing, iPad/Android device Eyes: diplopia, glare,  light sensitivity, eyeglasses, blindness Ears nose mouth throat: Hard of hearing, deaf, sign language,  vertigo,   bloody nose,  rhinitis, cold sores, snoring Cardiovascular: HTN, edema, arrhythmia, pacemaker in place, defibrillator in place,  chest pain/tightness, chronic anticoagulation, blood clot Respiratory:  difficulty breathing, denies congestion, SOB, wheezing, cough Gastrointestinal: abdominal pain, diarrhea, nausea, vomiting,  Genitourinary:  nocturia,  pain on urination,  blood in urine, Foley catheter, urinary urgency Musculoskeletal: Uses mobility aid,  cramping, stiff joints, painful joints,  Skin: +changes in toenails, color change dryness, itchy skin, mole changes, or rash  Neurological: numbness, paresthesias, burning in feet, denies fainting,  seizure, change in speech. denies headaches, memory problems/poor historian, cerebral palsy Endocrine: diabetes, hypothyroidism, hyperthyroidism,  dry mouth, flushing, denies heat intolerance,  cold intolerance,  excessive thirst, denies polyuria,  nocturia Hematological:  easy bleeding,  excessive bleeding, easy bruising, enlarged lymph nodes, on long term blood thinner Allergy/immunological:  hive, frequent infections, multiple drug allergies, seasonal allergies,   Psychiatric:  anxiety, depression, mood disorder, suicidal ideations, hallucinations   Objective: Vascular Examination: Capillary refill time immediate x 10 digits Dorsalis pedis and posterior tibial pulses present b/l No digital hair x 10 digits Skin temperature gradient WNL b/l  Dermatological Examination: Skin with normal turgor, texture and tone b/l  Toenails 1 b/l, 2 left, 4th b/l,  5th b/l discolored, thick, dystrophic with subungual debris and pain with palpation to nailbeds due to thickness of nails.  Anonychia right 2nd, 3rd and left 3rd digit.  Musculoskeletal: Muscle strength 5/5 to all LE muscle groups  Neurological: Sensation intact with 10 gram monofilament Vibratory sensation diminished.  Assessment: 1. Painful onychomycosis toenails 1 b/l, 2 left, 4th b/l,  5th b/l  1-5 b/l  2. NIDDM with neuropathy  Plan: 1. Discussed diabetic foot care principles. Literature dispensed on today. She was advised to purchase Eucerin Foot cream and apply to both feet once daily. She is to apply antibiotic ointment to toes as needed for dry, cracked skin. 2. Toenails 1 b/l, 2 left, 4th b/l,  5th b/l  were debrided in length and girth without iatrogenic bleeding. 3. Patient to continue soft, supportive shoe gear 4. Patient to report any pedal injuries to medical professional immediately. 5. Follow up 3 months. Patient/POA to call should there be a concern in the interim.

## 2018-02-04 ENCOUNTER — Other Ambulatory Visit: Payer: Self-pay | Admitting: Family Medicine

## 2018-03-03 ENCOUNTER — Encounter: Payer: Self-pay | Admitting: Endocrinology

## 2018-03-03 DIAGNOSIS — E119 Type 2 diabetes mellitus without complications: Secondary | ICD-10-CM | POA: Diagnosis not present

## 2018-03-03 LAB — HM DIABETES EYE EXAM

## 2018-03-08 IMAGING — DX DG SI JOINTS 3+V
3 series · 3 of 3 positions shown · non-contrast
Comparison: None.

CLINICAL DATA: SI joint tenderness along the right side.

EXAM:
BILATERAL SACROILIAC JOINTS - 3+ VIEW

[si joint (1 of 3)]
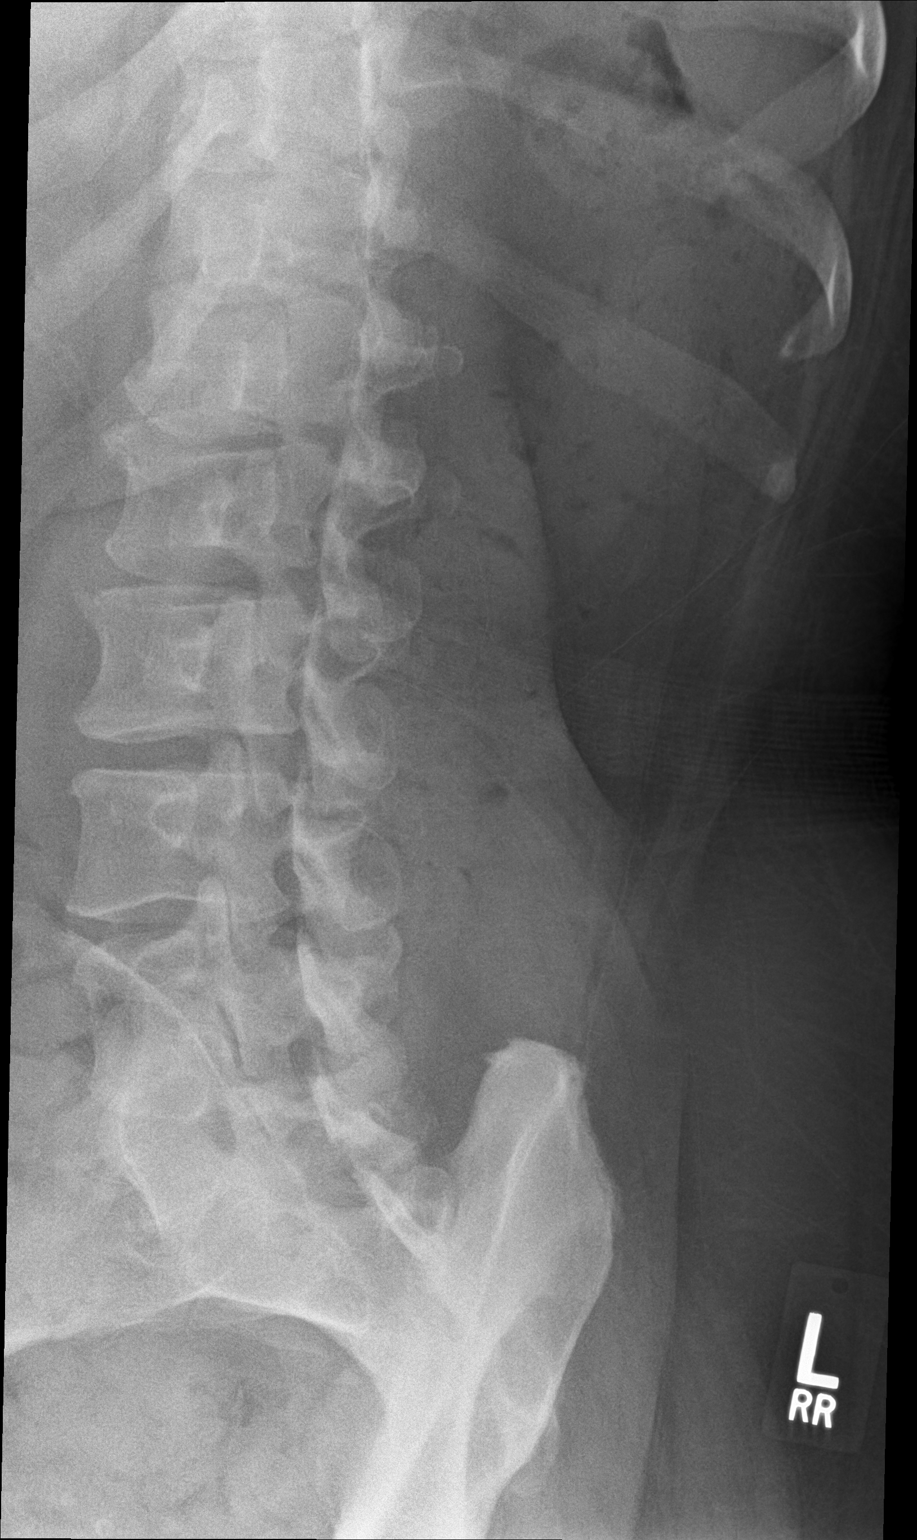

[si joint (2 of 3)]
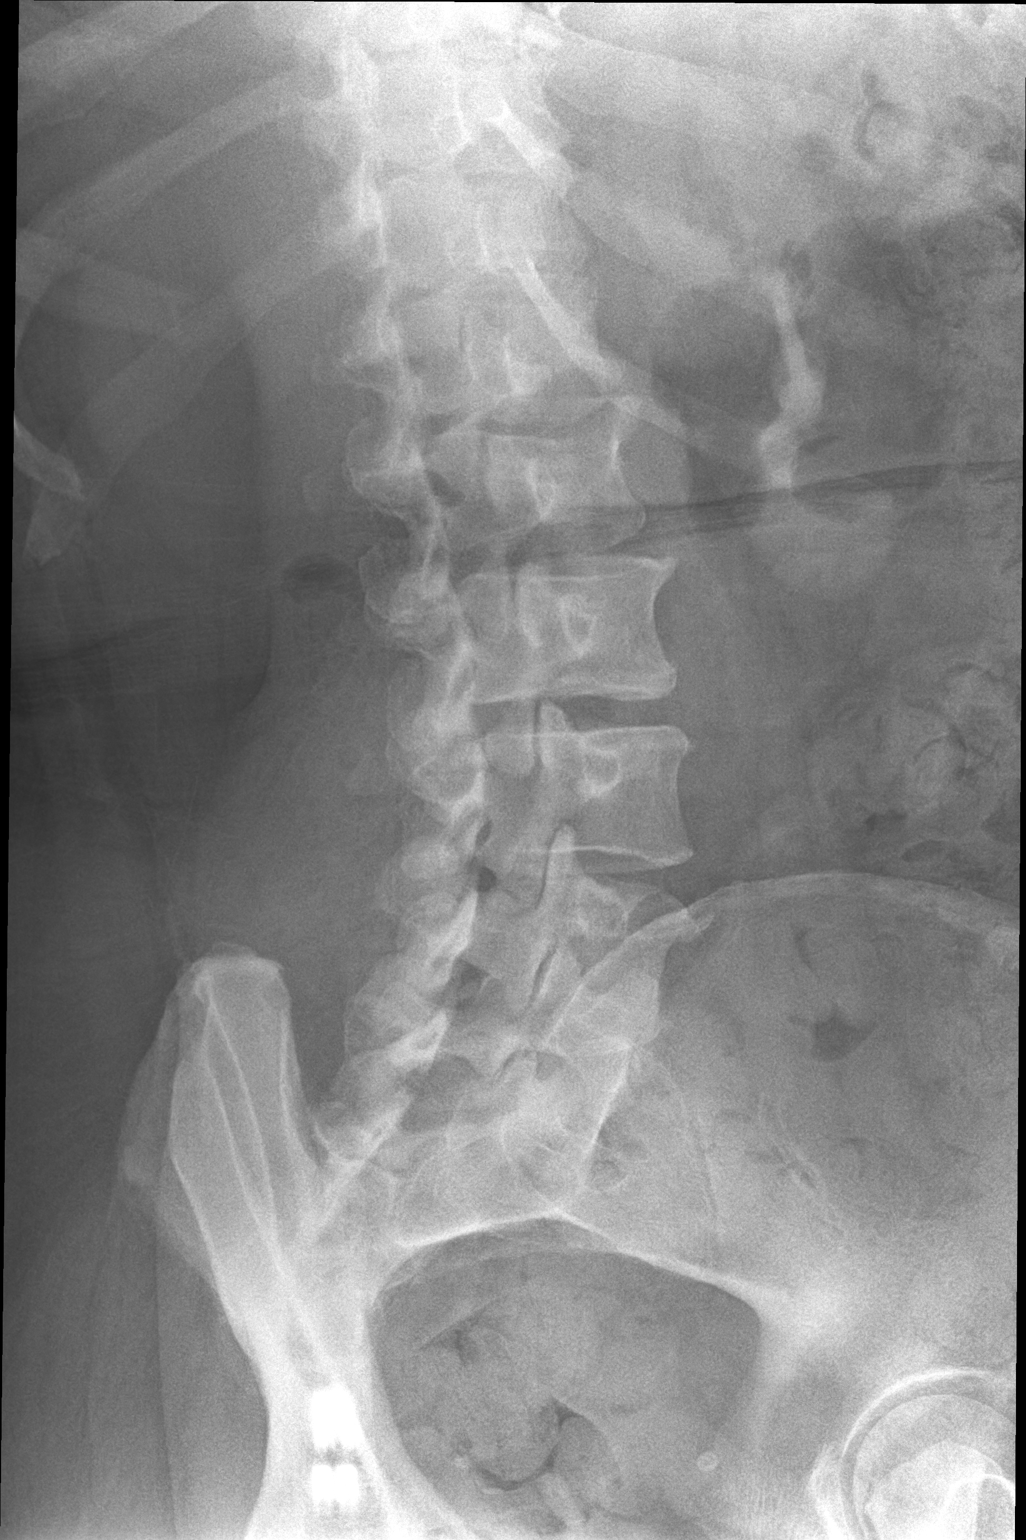

[si joint (3 of 3)]
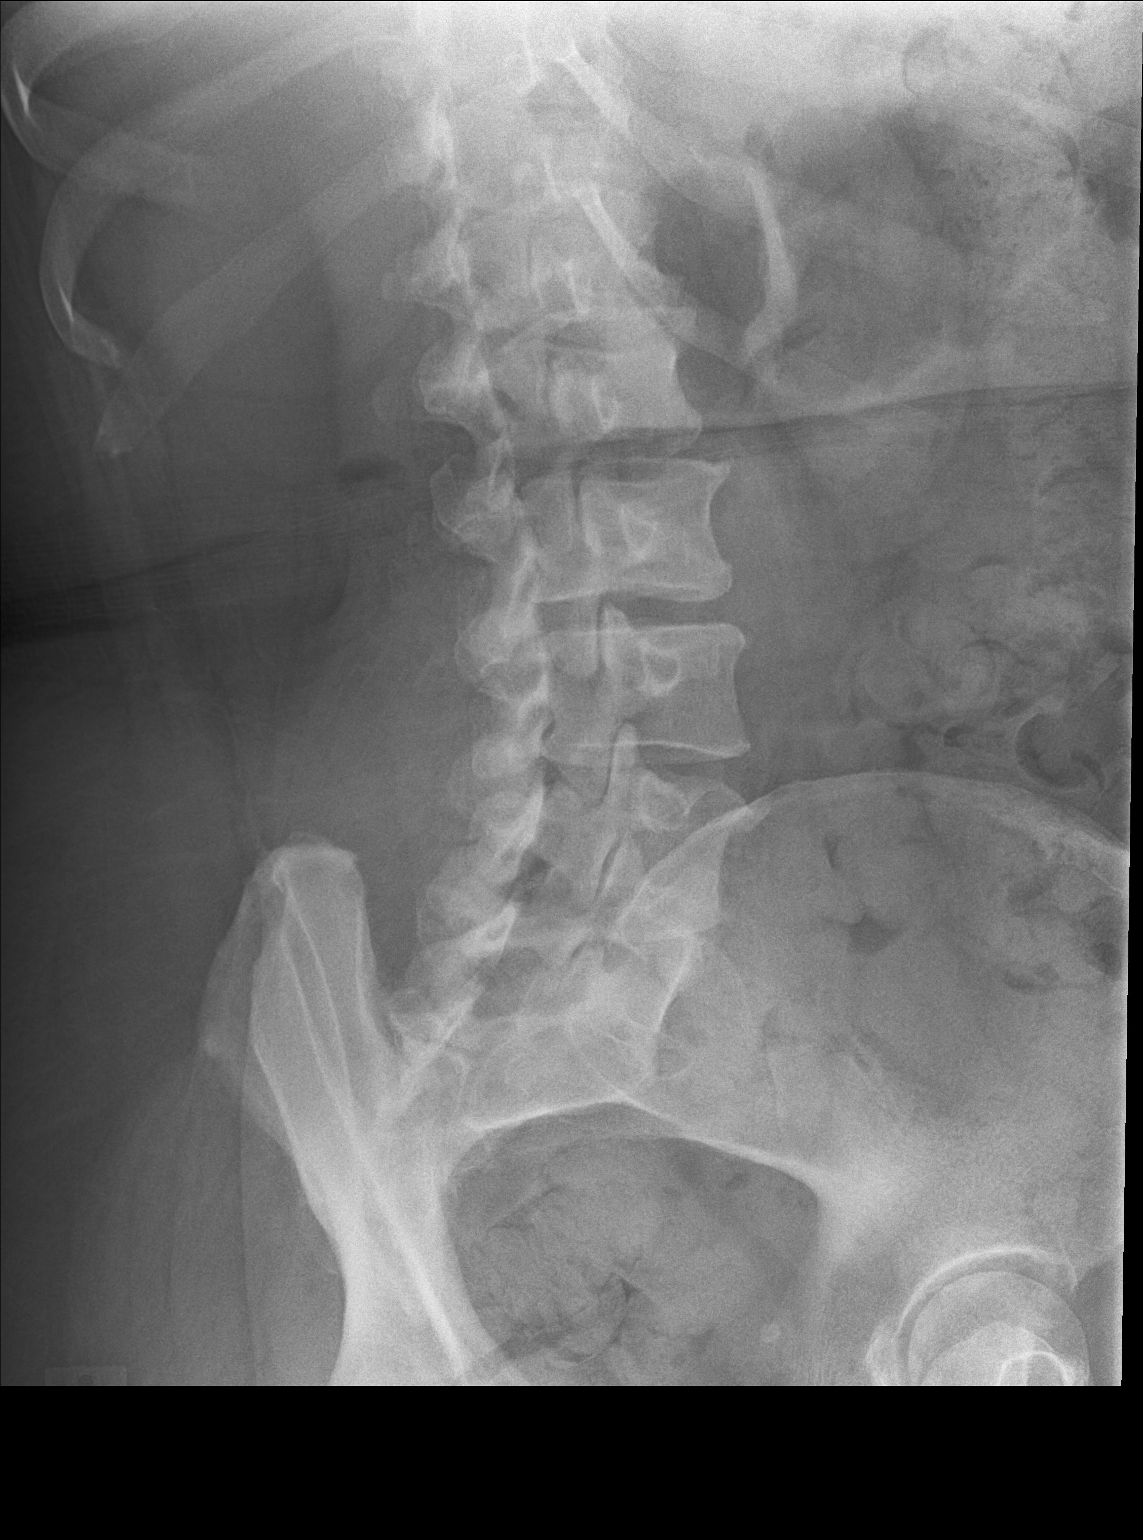

[3 of 3 positions shown; findings below may reference images not displayed]

FINDINGS: The sacroiliac joint spaces are maintained and there is no evidence
of arthropathy. No other bone abnormalities are seen.
IMPRESSION: Negative.

## 2018-04-07 ENCOUNTER — Ambulatory Visit: Payer: BLUE CROSS/BLUE SHIELD | Admitting: Podiatry

## 2018-04-23 ENCOUNTER — Other Ambulatory Visit: Payer: Self-pay | Admitting: Family Medicine

## 2018-06-16 ENCOUNTER — Other Ambulatory Visit: Payer: Self-pay | Admitting: Family Medicine

## 2018-06-16 DIAGNOSIS — E1140 Type 2 diabetes mellitus with diabetic neuropathy, unspecified: Secondary | ICD-10-CM

## 2018-06-16 NOTE — Telephone Encounter (Signed)
Metformin 500 MG tabs refill request LOV 12/25/17 Last ordered 02/04/18 No PCP listed, uses Dr. Leretha Pol for majority of visits.

## 2018-06-27 ENCOUNTER — Other Ambulatory Visit: Payer: Self-pay

## 2018-06-27 ENCOUNTER — Telehealth (INDEPENDENT_AMBULATORY_CARE_PROVIDER_SITE_OTHER): Payer: BLUE CROSS/BLUE SHIELD | Admitting: Family Medicine

## 2018-06-27 DIAGNOSIS — J4520 Mild intermittent asthma, uncomplicated: Secondary | ICD-10-CM

## 2018-06-27 DIAGNOSIS — Z9109 Other allergy status, other than to drugs and biological substances: Secondary | ICD-10-CM

## 2018-06-27 DIAGNOSIS — E1140 Type 2 diabetes mellitus with diabetic neuropathy, unspecified: Secondary | ICD-10-CM

## 2018-06-27 DIAGNOSIS — Z9884 Bariatric surgery status: Secondary | ICD-10-CM

## 2018-06-27 DIAGNOSIS — F418 Other specified anxiety disorders: Secondary | ICD-10-CM

## 2018-06-27 MED ORDER — GLUCOSE BLOOD VI STRP: ORAL_STRIP | 11 refills | Status: AC

## 2018-06-27 MED ORDER — BETAMETHASONE VALERATE 0.1 % EX OINT: 1.0000 "application " | TOPICAL_OINTMENT | Freq: Two times a day (BID) | CUTANEOUS | 0 refills | Status: AC

## 2018-06-27 MED ORDER — SERTRALINE HCL 50 MG PO TABS: 50.0000 mg | ORAL_TABLET | Freq: Every day | ORAL | 1 refills | Status: AC

## 2018-06-27 MED ORDER — METFORMIN HCL ER 500 MG PO TB24: 1000.0000 mg | ORAL_TABLET | Freq: Two times a day (BID) | ORAL | 1 refills | Status: AC

## 2018-06-27 MED ORDER — MONTELUKAST SODIUM 10 MG PO TABS: ORAL_TABLET | ORAL | 2 refills | Status: AC

## 2018-06-27 MED ORDER — ALBUTEROL SULFATE HFA 108 (90 BASE) MCG/ACT IN AERS: INHALATION_SPRAY | RESPIRATORY_TRACT | 1 refills | Status: AC

## 2018-06-27 NOTE — Progress Notes (Signed)
Virtual Visit Note  I connected with patient on 06/27/18 at 217pm  by phone and verified that I am speaking with the correct person using two identifiers. Shelby Scott is currently located at work and patient is currently with them during visit. The provider, Myles Lipps, MD is located in their office at time of visit.  I discussed the limitations, risks, security and privacy concerns of performing an evaluation and management service by telephone and the availability of in person appointments. I also discussed with the patient that there may be a patient responsible charge related to this service. The patient expressed understanding and agreed to proceed.   CC: anxiety and depression  HPI ? Patient is a 53 y.o. female with past medical history significant for DM2, depression with anxiety, seasonal allergies, asthma who presents today for anxiety and depression  Last OV aug 2019 [facility] Was in the process of transitioning from effexor to sertraline She started having issues getting refills on time Has been of medication for months now needing to get back on medication Depression and anxiety She will be moving to [place] a the end of this month Denies SI  Her asthma and seasonal allergies have been very well controlled this season just on singulair Uses albuterol rarely, an inhaler lasts several months  Down to 213 lbs Off all meds including insulin except for metformin BID Taking cbgs ~ 150s  Wt Readings from Last 3 Encounters:  12/25/17 230 lb 12.8 oz (104.7 kg)  12/11/17 230 lb (104.3 kg)  11/11/17 230 lb 6.4 oz (104.5 kg)    Requesting refill of betamethasone which she uses for poison ivy dermatitis.   Allergies  Allergen Reactions   Ibuprofen Other (See Comments)   Lisinopril Cough    Prior to Admission medications   Medication Sig Start Date End Date Taking? Authorizing Provider  ACCU-CHEK FASTCLIX LANCETS MISC USE  TO CHECK BLOOD SUGAR BID 09/16/15   [provider]  ACCU-CHEK SMARTVIEW test strip USE TWICE DAILY 09/08/16   Romero Belling, MD  ACETYLCARN-ALPHA LIPOIC ACID PO Take 1 tablet by mouth 2 (two) times daily.    [provider]  Adapalene-Benzoyl Peroxide (EPIDUO EX) Apply 1 application topically at bedtime.    [provider]  albuterol (PROAIR HFA) 108 (90 Base) MCG/ACT inhaler INHALE 2 PUFFS INTO THE LUNGS EVERY 6 HOURS AS NEEDED FOR WHEEZING OR SHORTNESS OF BREATH 06/30/17   Myles Lipps, MD  cetirizine (ZYRTEC) 10 MG tablet Take 1 tablet (10 mg total) by mouth daily. 12/25/17   Doristine Bosworth, MD  CIPRODEX OTIC suspension  08/28/16   [provider]  clobetasol (TEMOVATE) 0.05 % external solution  11/05/17   [provider]  cyclobenzaprine (FLEXERIL) 5 MG tablet  09/14/15   [provider]  DOK 100 MG capsule Take 100 mg by mouth 2 (two) times daily. 01/03/18   [provider]  doxycycline (VIBRA-TABS) 100 MG tablet TK 1 T PO BID WITH FOOD/WATER. AVOID SUNLIGHT WHILE TAKING THIS MEDICATION. 11/05/17   [provider]  fluconazole (DIFLUCAN) 150 MG tablet  11/17/17   [provider]  fluticasone (FLONASE) 50 MCG/ACT nasal spray Place 2 sprays into both nostrils daily. 06/06/16   Peyton Najjar, MD  fluticasone (FLOVENT HFA) 44 MCG/ACT inhaler Inhale 1 puff into the lungs 2 (two) times daily. 06/30/17   Myles Lipps, MD  gabapentin (NEURONTIN) 300 MG capsule gabapentin 300 mg capsule  10/14/15   [provider]  glimepiride (AMARYL) 2 MG tablet glimepiride 2 mg tablet    [provider]  glipiZIDE (GLUCOTROL) 10 MG tablet glipizide 10 mg tablet    [provider]  hydrOXYzine (ATARAX/VISTARIL) 25 MG tablet Take 0.5-1 tablets (12.5-25 mg total) by mouth every 8 (eight) hours as needed for itching. 12/11/17   Bing NeighborsHarris, Kimberly S, FNP  insulin aspart (NOVOLOG FLEXPEN) 100 UNIT/ML FlexPen ADM 10 UNITS  [place] TID WITH A MEAL 07/29/15   [provider]  Insulin Detemir (LEVEMIR FLEXTOUCH) 100 UNIT/ML Pen Levemir FlexTouch U-100 Insulin 100 unit/mL (3 mL) subcutaneous pen    [provider]  Insulin Glargine (LANTUS SOLOSTAR) 100 UNIT/ML Solostar Pen ADM 30 UNI [place] HS 08/31/15   [provider]  Insulin Pen Needle (BD PEN NEEDLE NANO U/F) 32G X 4 MM MISC USE TO CHECK BLOOD SUGAR QID 10/13/15   [provider]  ketoconazole (NIZORAL) 2 % cream  11/11/17   [provider]  levofloxacin (LEVAQUIN) 500 MG tablet levofloxacin 500 mg tablet    [provider]  lisinopril (PRINIVIL,ZESTRIL) 10 MG tablet lisinopril 10 mg tablet    [provider]  metFORMIN (GLUCOPHAGE-XR) 500 MG 24 hr tablet TAKE 4 TABLETS(2000 MG) BY MOUTH DAILY WITH BREAKFAST 06/16/18   Myles LippsSantiago, Leba Tibbitts M, MD  methocarbamol (ROBAXIN) 500 MG tablet TK 1 T PO BID 10/20/15   [provider]  montelukast (SINGULAIR) 10 MG tablet TAKE 1 TABLET(10 MG) BY MOUTH AT BEDTIME 04/25/18   Myles LippsSantiago, Vincent Streater M, MD  Multiple Vitamins-Minerals (MULTIVITAMIN WITH MINERALS) tablet Take by mouth.    [provider]  naproxen (NAPROSYN) 500 MG tablet TK 1 T PO  BID 10/20/15   [provider]  naproxen sodium (ANAPROX) 550 MG tablet naproxen sodium 550 mg tablet    [provider]  nystatin (MYCOSTATIN/NYSTOP) powder Apply topically 2 (two) times daily. 10/13/17   Myles LippsSantiago, Omir Cooprider M, MD  ofloxacin (FLOXIN) 0.3 % OTIC solution ofloxacin 0.3 % ear drops    [provider]  ondansetron (ZOFRAN-ODT) 4 MG disintegrating tablet Take 1 tablet (4 mg total) by mouth every 8 (eight) hours as needed for nausea or vomiting. 05/25/17   Wallis BambergMani, Mario, PA-C  orphenadrine (NORFLEX) 100 MG tablet orphenadrine citrate ER 100 mg tablet,extended release    [provider]  permethrin (ELIMITE) 5 % cream permethrin 5 % topical cream    [provider]  Polyethyl Glycol-Propyl Glycol  (SYSTANE) 0.4-0.3 % GEL ophthalmic gel Place 1 application into both eyes 2 (two) times daily as needed (for dry/irritated eyes).    [provider]  ranitidine (ZANTAC) 150 MG tablet Take 1 tablet (150 mg total) by mouth 2 (two) times daily. 12/25/17   Doristine BosworthStallings, Zoe A, MD  traMADol (ULTRAM) 50 MG tablet Take 1 tablet (50 mg total) by mouth every 8 (eight) hours as needed. 10/31/16   Trena PlattEnglish, Stephanie D, PA  triamcinolone cream (KENALOG) 0.1 % Apply 1 application topically 2 (two) times daily. 07/15/17   Myles LippsSantiago, Jerime Arif M, MD  venlafaxine XR (EFFEXOR-XR) 150 MG 24 hr capsule TAKE 1 CAPSULE(150 MG) BY MOUTH DAILY WITH BREAKFAST 04/06/17   Shade FloodGreene, Jeffrey R, MD  VICTOZA 18 MG/3ML SOPN INJECT 1.8 MG UNDER THE SKIN DAILY AS DIRECTED Patient not taking: Reported on 01/06/2018 02/19/17   Romero BellingEllison, Sean, MD    Past Medical History:  Diagnosis Date   Anemia    Anxiety    Asthma  Depression    Diabetes mellitus without complication (HCC)    Hypertension     Past Surgical History:  Procedure Laterality Date   CHOLECYSTECTOMY     GASTRIC ROUX-EN-Y N/A 06/08/2016   Procedure: LAPAROSCOPIC ROUX-EN-Y GASTRIC BYPASS WITH UPPER ENDOSCOPY;  Surgeon: Luretha Murphy, MD;  Location: [facility];  Service: General;  Laterality: N/A;   UPPER GI ENDOSCOPY  06/08/2016   Procedure: UPPER GI ENDOSCOPY;  Surgeon: Luretha Murphy, MD;  Location: [facility];  Service: General;;    Social History   Tobacco Use   Smoking status: Never Smoker   Smokeless tobacco: Never Used  Substance Use Topics   Alcohol use: No    Alcohol/week: 0.0 standard drinks    Family History  Problem Relation Age of Onset   Hypertension Father    Diabetes Maternal Grandmother    Mental illness Maternal Grandfather    Diabetes Paternal Grandmother    Hypertension Paternal Grandmother    Heart disease Paternal Grandfather    Diabetes Brother    Mental illness Brother     ROS Per hpi  Objective  Vitals as  reported by the patient: done   ASSESSMENT and PLAN  1. Depression with anxiety Not well controlled, denies current SI. starting sertraline at  once a day for 2 weeks, then increase to  once a day. Med r/se/b reviewed.  2. Type 2 diabetes mellitus with diabetic neuropathy, without long-term current use of insulin (HCC) Checking labs, medications will be adjusted as needed.  - metFORMIN (GLUCOPHAGE-XR) 500 MG 24 hr tablet; Take 2 tablets (1,000 mg total) by mouth 2 (two) times daily with a meal. - Hemoglobin A1c; Future - Comprehensive metabolic panel; Future - Microalbumin / creatinine urine ratio; Future  3. Environmental allergies 4. Mild intermittent asthma without complication Controlled. Continue current regime.   5. S/P gastric bypass  Other orders - albuterol (PROAIR HFA) 108 (90 Base) MCG/ACT inhaler; INHALE 2 PUFFS INTO THE LUNGS EVERY 6 HOURS AS NEEDED FOR WHEEZING OR SHORTNESS OF BREATH - montelukast (SINGULAIR) 10 MG tablet; TAKE 1 TABLET(10 MG) BY MOUTH AT BEDTIME - glucose blood (ACCU-CHEK SMARTVIEW) test strip; USE TWICE DAILY - sertraline (ZOLOFT) 50 MG tablet; Take 1 tablet (50 mg total) by mouth daily. - betamethasone valerate ointment (VALISONE) 0.1 %; Apply 1 application topically 2 (two) times daily.  FOLLOW-UP: prn, patient [place] out of state   The above assessment and management plan was discussed with the patient. The patient verbalized understanding of and has agreed to the management plan. Patient is aware to call the clinic if symptoms persist or worsen. Patient is aware when to return to the clinic for a follow-up visit. Patient educated on when it is appropriate to go to the emergency department.    I provided 25 minutes of non-face-to-face time during this encounter.  Myles Lipps, MD Primary Care at [facility] [address] [place], [place] [address] Ph.  [phone] Fax [phone]

## 2018-06-27 NOTE — Progress Notes (Signed)
Needing refills on pended meds, also wanting to discuss anxiety and depression

## 2018-07-05 ENCOUNTER — Other Ambulatory Visit: Payer: Self-pay

## 2018-07-05 ENCOUNTER — Ambulatory Visit (INDEPENDENT_AMBULATORY_CARE_PROVIDER_SITE_OTHER): Payer: BLUE CROSS/BLUE SHIELD | Admitting: Emergency Medicine

## 2018-07-05 DIAGNOSIS — E1140 Type 2 diabetes mellitus with diabetic neuropathy, unspecified: Secondary | ICD-10-CM | POA: Diagnosis not present

## 2018-07-05 LAB — COMPREHENSIVE METABOLIC PANEL
ALT: 23 IU/L (ref 0–32)
AST: 21 IU/L (ref 0–40)
Albumin/Globulin Ratio: 1.7 (ref 1.2–2.2)
Albumin: 4.4 g/dL (ref 3.8–4.9)
Alkaline Phosphatase: 134 IU/L — ABNORMAL HIGH (ref 39–117)
BUN/Creatinine Ratio: 12 (ref 9–23)
BUN: 9 mg/dL (ref 6–24)
Bilirubin Total: 0.3 mg/dL (ref 0.0–1.2)
CO2: 25 mmol/L (ref 20–29)
Calcium: 9.7 mg/dL (ref 8.7–10.2)
Chloride: 102 mmol/L (ref 96–106)
Creatinine, Ser: 0.78 mg/dL (ref 0.57–1.00)
GFR calc Af Amer: 100 mL/min/{1.73_m2} (ref >59)
GFR calc non Af Amer: 87 mL/min/{1.73_m2} (ref >59)
Globulin, Total: 2.6 g/dL (ref 1.5–4.5)
Glucose: 138 mg/dL — ABNORMAL HIGH (ref 65–99)
Potassium: 4.2 mmol/L (ref 3.5–5.2)
Sodium: 141 mmol/L (ref 134–144)
Total Protein: 7 g/dL (ref 6.0–8.5)

## 2018-07-05 LAB — HEMOGLOBIN A1C
Est. average glucose Bld gHb Est-mCnc: 140 mg/dL
Hgb A1c MFr Bld: 6.5 % — ABNORMAL HIGH (ref 4.8–5.6)

## 2018-07-06 LAB — MICROALBUMIN / CREATININE URINE RATIO
Creatinine, Urine: 143.4 mg/dL
Microalb/Creat Ratio: 11 mg/g creat (ref 0–29)
Microalbumin, Urine: 15.4 ug/mL

## 2018-07-13 ENCOUNTER — Telehealth: Payer: Self-pay | Admitting: Family Medicine

## 2018-07-13 NOTE — Telephone Encounter (Signed)
Copied from CRM [id number]. Topic: Quick Communication - Lab Results (Clinic Use ONLY) >> Jul 13, 2018  7:57 AM Leafy Ro wrote: Pt is calling and would like blood work result

## 2018-07-13 NOTE — Telephone Encounter (Signed)
Copied from CRM [id number]. Topic: Quick Communication - Rx Refill/Question >> Jul 13, 2018 10:37 AM Louie Bun, Rosey Bath D wrote: Medication:Prednisone due to poison ivey  Has the patient contacted their pharmacy? Yes (Agent: If no, request that the patient contact the pharmacy for the refill.) (Agent: If yes, when and what did the pharmacy advise?)  Preferred Pharmacy (with phone number or street name): [id number] - [place], [place] - [address] AT [facility] OF [address]  Agent: Please be advised that RX refills may take up to 3 business days. We ask that you follow-up with your pharmacy.

## 2018-07-19 NOTE — Telephone Encounter (Signed)
Spoke with pt and she states she has already been evaluated and given Rx, I verbalized understanding.

## 2018-07-26 ENCOUNTER — Telehealth: Payer: Self-pay | Admitting: Family Medicine

## 2018-07-26 NOTE — Telephone Encounter (Signed)
Copied from CRM [id number]. Topic: General - Other >> Jul 26, 2018  8:44 AM Leafy Ro wrote: Reason for CRM: pt is calling and would like another round of prednisone . [facility]  [address] [place] ,[place] phone number [phone]

## 2018-07-26 NOTE — Telephone Encounter (Signed)
Spoke with pt this morning and advised her that for [place] to seen her any Rx for her rash she would need to make an appointment. Pt states she does not have insurance so she cant make an appointment. I advised her that due to the medication she is requesting she would need an appointment, she verbalized understanding.

## 2018-12-29 ENCOUNTER — Encounter (HOSPITAL_COMMUNITY): Payer: Self-pay

## 2020-01-04 ENCOUNTER — Encounter (HOSPITAL_COMMUNITY): Payer: Self-pay
